# Patient Record
Sex: Male | Born: 1945 | Race: White | Hispanic: No | Marital: Married | Smoking: Never smoker
Health system: Southern US, Community
[De-identification: ages and names within clinical notes are randomized; demographics above are authoritative.]

## PROBLEM LIST (undated history)

## (undated) DIAGNOSIS — G43909 Migraine, unspecified, not intractable, without status migrainosus: Secondary | ICD-10-CM

## (undated) DIAGNOSIS — E079 Disorder of thyroid, unspecified: Secondary | ICD-10-CM

## (undated) DIAGNOSIS — N400 Enlarged prostate without lower urinary tract symptoms: Secondary | ICD-10-CM

## (undated) HISTORY — PX: OTHER SURGICAL HISTORY: SHX169

## (undated) HISTORY — DX: Benign prostatic hyperplasia without lower urinary tract symptoms: N40.0

## (undated) HISTORY — DX: Disorder of thyroid, unspecified: E07.9

## (undated) HISTORY — PX: HERNIA REPAIR: SHX51

## (undated) HISTORY — DX: Migraine, unspecified, not intractable, without status migrainosus: G43.909

---

## 1997-05-26 ENCOUNTER — Encounter: Admission: RE | Admit: 1997-05-26 | Discharge: 1997-08-24 | Payer: Self-pay | Admitting: Family Medicine

## 1999-11-09 ENCOUNTER — Encounter: Admission: RE | Admit: 1999-11-09 | Discharge: 1999-11-09 | Payer: Self-pay | Admitting: General Surgery

## 1999-11-09 ENCOUNTER — Encounter (HOSPITAL_BASED_OUTPATIENT_CLINIC_OR_DEPARTMENT_OTHER): Payer: Self-pay | Admitting: General Surgery

## 1999-11-13 ENCOUNTER — Encounter (INDEPENDENT_AMBULATORY_CARE_PROVIDER_SITE_OTHER): Payer: Self-pay | Admitting: *Deleted

## 1999-11-13 ENCOUNTER — Ambulatory Visit (HOSPITAL_BASED_OUTPATIENT_CLINIC_OR_DEPARTMENT_OTHER): Admission: RE | Admit: 1999-11-13 | Discharge: 1999-11-13 | Payer: Self-pay | Admitting: General Surgery

## 1999-11-13 ENCOUNTER — Emergency Department (HOSPITAL_COMMUNITY): Admission: EM | Admit: 1999-11-13 | Discharge: 1999-11-13 | Payer: Self-pay | Admitting: Emergency Medicine

## 2002-07-07 ENCOUNTER — Ambulatory Visit (HOSPITAL_COMMUNITY): Admission: RE | Admit: 2002-07-07 | Discharge: 2002-07-07 | Payer: Self-pay | Admitting: Gastroenterology

## 2004-11-13 ENCOUNTER — Encounter: Admission: RE | Admit: 2004-11-13 | Discharge: 2004-11-13 | Payer: Self-pay | Admitting: Family Medicine

## 2005-01-09 ENCOUNTER — Ambulatory Visit: Payer: Self-pay | Admitting: Internal Medicine

## 2005-05-07 ENCOUNTER — Ambulatory Visit: Payer: Self-pay | Admitting: Gastroenterology

## 2005-05-18 ENCOUNTER — Ambulatory Visit: Payer: Self-pay | Admitting: Gastroenterology

## 2007-06-23 ENCOUNTER — Ambulatory Visit (HOSPITAL_COMMUNITY): Admission: RE | Admit: 2007-06-23 | Discharge: 2007-06-23 | Payer: Self-pay | Admitting: Chiropractic Medicine

## 2009-06-17 ENCOUNTER — Ambulatory Visit (HOSPITAL_COMMUNITY): Admission: RE | Admit: 2009-06-17 | Discharge: 2009-06-17 | Payer: Self-pay | Admitting: Cardiology

## 2009-07-12 ENCOUNTER — Ambulatory Visit: Payer: Self-pay | Admitting: Internal Medicine

## 2009-07-12 DIAGNOSIS — E785 Hyperlipidemia, unspecified: Secondary | ICD-10-CM | POA: Insufficient documentation

## 2009-07-12 DIAGNOSIS — E782 Mixed hyperlipidemia: Secondary | ICD-10-CM | POA: Insufficient documentation

## 2009-07-12 DIAGNOSIS — R0609 Other forms of dyspnea: Secondary | ICD-10-CM | POA: Insufficient documentation

## 2009-07-12 DIAGNOSIS — R0989 Other specified symptoms and signs involving the circulatory and respiratory systems: Secondary | ICD-10-CM

## 2009-07-12 DIAGNOSIS — Z87448 Personal history of other diseases of urinary system: Secondary | ICD-10-CM | POA: Insufficient documentation

## 2009-07-12 DIAGNOSIS — Z87898 Personal history of other specified conditions: Secondary | ICD-10-CM | POA: Insufficient documentation

## 2009-07-12 DIAGNOSIS — G43909 Migraine, unspecified, not intractable, without status migrainosus: Secondary | ICD-10-CM | POA: Insufficient documentation

## 2009-08-15 ENCOUNTER — Encounter (HOSPITAL_COMMUNITY)
Admission: RE | Admit: 2009-08-15 | Discharge: 2009-09-20 | Payer: Self-pay | Source: Home / Self Care | Admitting: Internal Medicine

## 2010-01-31 NOTE — Assessment & Plan Note (Signed)
Summary: Pulmonary consultation/ ex intol ? etiology   Visit Type:  Initial Consult Copy to:  Dr. Denita Lung Primary Provider/Referring Provider:  Dr. Denita Lung  CC:  "Burning in lungs" .  History of Present Illness: 18 yowm never smoker new onset ex intolerance first noted spring 2010 but much worse in March 2011 assoc with chest burning leading to cardiac w/u with nl L Ht cath by [facility].  July 12, 2009 cc doe x > 1 year eg walking parking space to room uphill but no longer doing this and no longer pushing himself aerobically.  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  The patient denies shortness of breath at rest, productive cough, non-productive cough, coughing up blood, chest pain, irregular heartbeats, acid heartburn, indigestion, loss of appetite, weight change, difficulty swallowing, sore throat, tooth/dental problems, headaches, nasal congestion/difficulty breathing through nose, sneezing, itching, ear ache, anxiety, depression, hand/feet swelling, joint stiffness or pain, rash, change in color of mucus, and fever.    Vital Signs:  Patient profile:   65 year old male Height:      70 inches Weight:      197.38 pounds BMI:     28.42 O2 Sat:      98 % on Room air Temp:     98.2 degrees F oral Pulse rate:   59 / minute BP sitting:   122 / 70  (left arm)  Vitals Entered By: Vernie Murders (July 12, 2009 10:14 AM)  O2 Flow:  Room air   Other Orders: T-2 View CXR ([id number])  Patient Instructions: 1)  To resume aerobic activity - push yourself up to 30 min a day where you are sensing short of breath but never  out of breath 2)  If symptoms come back,  Acid reflux is the leading suspect here and needs to be eliminated  completely before considering additional studies or treatment options. To suppress this maximally, take prilosec 20mg  30 min  before first and last meal and plus diet measures as listed.  3)  GERD (REFLUX)  is a common cause of respiratory symptoms. It commonly presents without heartburn and can be treated with medication, but also with lifestyle changes including avoidance of late meals, excessive alcohol, smoking cessation, and avoid fatty foods, chocolate, peppermint, colas, red wine, and acidic juices such as orange juice. NO MINT OR MENTHOL PRODUCTS SO NO COUGH DROPS  4)  USE SUGARLESS CANDY INSTEAD (jolley ranchers)  5)  NO OIL BASED VITAMINS  6)  If not satisfied call me  Appended Document: Pulmonary consultation/ ex intol ? etiology      Copy to:  Dr. Denita Lung Primary Provider/Referring Provider:  Dr. Denita Lung   History of Present Illness: 65 yowm never smoker new onset ex intolerance first noted spring 2010 but much worse in March 2011 assoc with chest burning leading to cardiac w/u with nl L Ht cath by [facility] 06/17/09 with ? micorvascular angina rx with [facility] but d/c'd all rx on his own prior to initial pulmonary eval  July 12, 2009  1st pulmonary office eval   cc doe x > 1 year  eg walking parking space to room uphill but no longer doing this but  no longer pushing himself aerobically at all with much less symptoms now. No assoc hb or cough.  Pt denies any significant sore throat, dysphagia, itching, sneezing,  nasal congestion or excess secretions,  fever, chills, sweats, unintended wt loss, pleuritic or exertional cp, hempoptysis, change in activity tolerance  orthopnea pnd or leg swelling.  Pt also denies any obvious fluctuation in symptoms with weather or environmental change or other alleviating or aggravating factors.       Allergies: No Known Drug  Allergies  Past History:  Past Medical History: Hyperlipidemia Chest Burning with exercise     - Cardiac w/u with L HCath 06/17/09 LVEDP 17 ? microvascular angina  Family History: Lymphoma- Father Negative for respiratory diseases or atopy   Review of Systems  The patient denies anorexia, fever, weight loss, weight gain, vision loss, decreased hearing, hoarseness, chest pain, syncope, dyspnea on exertion, peripheral edema, prolonged cough, headaches, hemoptysis, abdominal pain, melena, hematochezia, severe indigestion/heartburn, hematuria, incontinence, genital sores, muscle weakness, suspicious skin lesions, transient blindness, difficulty walking, depression, unusual weight change, abnormal bleeding, enlarged lymph nodes, angioedema, breast masses, and testicular masses.    Serial Vital Signs/Assessments:  Comments: 11:02 AM Ambulatory Pulse Oximetry  Resting; HR__65___    02 Sat__97%ra___  Lap1 (185 feet)   HR__87___   02 Sat__96%ra___ Lap2 (185 feet)   HR__88___   02 Sat__96%ra___    Lap3 (185 feet)   HR__84___   02 Sat__97%ra___  _x__Test Completed without Difficulty ___Test Stopped due to:   By: Vernie Murders    Physical Exam  Additional Exam:  amb wm with classic voice fatigue and pseudowheeze resolves with purse lip maneuver wt 197 July 12, 2009 HEENT: nl dentition, turbinates, and orophanx. Nl external ear canals without cough reflex NECK :  without JVD/Nodes/TM/ nl carotid upstrokes bilaterally LUNGS: no acc muscle use, clear to A and P bilaterally without cough on insp or exp maneuvers CV:  RRR  no s3 or murmur or increase in P2, no edema  ABD:  soft and nontender with nl excursion in the supine position. No bruits or organomegaly, bowel sounds nl MS:  warm without deformities, calf tenderness, cyanosis or clubbing SKIN: warm and dry without lesions   NEURO:  alert, approp, no deficits     CXR  Procedure date:  07/12/2009  Findings:         Comparison: None.   Findings: Normal mediastinum and cardiac silhouette.  Costophrenic angles are clear.  No effusion, infiltrate, or pneumothorax.   IMPRESSION: No acute cardiopulmonary proces  Impression & Recommendations:  Problem # 1:  DYSPNEA (ICD-786.09) Not reproducible here and no evidence of a pulmonary limitation which could be largely deconditioning but this wouldn't explain the chest burning  which could have been microvasuclar angina as suggested by cards and has yet to push himself to the level he was previously or this could be an airway issue.    DDX of  difficult airways managment all start with A and  include Adherence, Ace Inhibitors, Acid Reflux, Active Sinus Disease, Alpha 1 Antitripsin deficiency, Anxiety masquerading as Airways dz,  ABPA,  allergy(esp in young), Aspiration (esp in elderly), Adverse effects of DPI,  Active smokers, plus one B  = Beta blocker use..   Acid reflux could be a unifying dx. try ppi/diet and f/u  Anxiety also a concern but is a dx of exclusion with CPST the next step if not  satisfied with response to rx.  Other Orders: Pulse Oximetry, Ambulatory (44010) Consultation Level V (27253)  Patient Instructions: 1)  1)  To resume aerobic activity - push yourself up to 30 min a day where you are sensing short of breath but never out of breath 2)  2)  If symptoms come back,  Acid reflux is the leading suspect here and needs to be eliminated  completely before considering additional studies or treatment options. To suppress this maximally, take prilosec 20mg  30 min  before first and last meal and plus diet measures as listed.  3)  3)  GERD (REFLUX)  is a common cause of respiratory symptoms. It commonly presents without heartburn and can be treated with medication, but also with lifestyle changes including avoidance of late meals, excessive alcohol, smoking cessation, and avoid fatty foods, chocolate, peppermint, colas, red wine, and acidic juices  such as orange juice. NO MINT OR MENTHOL PRODUCTS SO NO COUGH DROPS  4)  4)  USE SUGARLESS CANDY INSTEAD (jolley ranchers)  5)  5)  NO OIL BASED VITAMINS  6)  6)  If not satisfied call me to Schedule CPST

## 2010-05-19 NOTE — Op Note (Signed)
[place]  Patient:    Joseph Yang, Joseph Yang                    MRN: [id number] Proc. Date: 11/13/99 Adm. Date:  [id number] Attending:  Fortino Sic CC:         Barbette Hair. Arlyce Dice, M.D. [facility]   Operative Report  PREOPERATIVE DIAGNOSIS:  Fissure in ano  POSTOPERATIVE DIAGNOSIS:  Fissure in ano.  PROCEDURE:  Proctofissurectomy, sphincterotomy, and rubber banding of internal hemorrhoid.  SURGEON:  Marnee Spring. Wiliam Ke, M.D.  ASSISTANT:  None.  ANESTHESIA:  Endotracheal by hospital.  PROCEDURE:  After endotracheal anesthesia with the patient in the dorsolithotomy position the skin of the perineum was prepped and draped in the usual manner.  Procto was performed to 15 cm and was essentially negative. The patient had an anterior midline fissure in ano with a sentinel tag but no hyper ______ papilla.  This area was infiltrated with Xylocaine and Marcaine anesthesia.  A 2-0 silk suture was placed proximally.  The sentinel pile and fissure was excised and the wound was closed using the 3-0 chromic suture in an interlocking continuous fashion.  There was a large internal hemorrhoid just to the right of the anterior midline, and this was rubber banded above the dentate line with the Barons ligator.  A left lateral sphincterotomy was then performed.  The area in the left lateral part of the anus was infiltrated with Marcaine anesthesia.  The incision was made from the dentate line to the anal verge.  The sphincter muscle was pulled into the wound and divided with electrocautery current. This wound was then closed with running interlocking 3-0 chromic suture.  All the suture lines were examined for bleeding and were hemostatic.  The wound was dressed with Dibucaine ointment and 4 x 4s.  The patient was taken down from the dorsolithotomy position and the left the operating room in satisfactory condition after sponge and needle counts were verified. DD:   11/13/99 TD:  11/13/99 Job: [id number]

## 2010-06-06 ENCOUNTER — Telehealth: Payer: Self-pay | Admitting: Internal Medicine

## 2010-06-06 NOTE — Telephone Encounter (Signed)
Joseph Yang w/ short stay returned call.  She states that pt is scheduled for right umbilical hernia repair on 6.13.12 by Dr Luretha Murphy and is requesting pt's last cxr and ov note for pre-op for this procedure faxed to [phone], call-back is [phone].  Pt's last ov w/ MW was 7.12.11 with the cxr.  No upcoming appts w/ MW.  Note and cxr printed from EMR and faxed to the number above.  Called spoke with patient, advised records have been sent.  Pt verbalized his understanding.

## 2010-06-06 NOTE — Telephone Encounter (Signed)
LM for Joseph Yang TCB to find out what info she is needing.

## 2010-06-09 ENCOUNTER — Other Ambulatory Visit (HOSPITAL_COMMUNITY): Payer: BC Managed Care – PPO

## 2010-06-12 ENCOUNTER — Encounter (HOSPITAL_COMMUNITY): Payer: BC Managed Care – PPO

## 2010-06-12 ENCOUNTER — Other Ambulatory Visit (INDEPENDENT_AMBULATORY_CARE_PROVIDER_SITE_OTHER): Payer: Self-pay | Admitting: Surgery

## 2010-06-12 LAB — CBC
HCT: 44.6 % (ref 39.0–52.0)
MCH: 30.1 pg (ref 26.0–34.0)
MCV: 87.6 fL (ref 78.0–100.0)
RDW: 13.2 % (ref 11.5–15.5)
WBC: 4.2 10*3/uL (ref 4.0–10.5)

## 2010-06-14 ENCOUNTER — Ambulatory Visit (HOSPITAL_COMMUNITY)
Admission: RE | Admit: 2010-06-14 | Discharge: 2010-06-14 | Disposition: A | Discharge status: Home / Self Care | Payer: BC Managed Care – PPO | Source: Ambulatory Visit | Attending: Surgery | Admitting: Surgery

## 2010-06-14 DIAGNOSIS — K4090 Unilateral inguinal hernia, without obstruction or gangrene, not specified as recurrent: Secondary | ICD-10-CM | POA: Insufficient documentation

## 2010-06-14 DIAGNOSIS — Z7982 Long term (current) use of aspirin: Secondary | ICD-10-CM | POA: Insufficient documentation

## 2010-06-14 DIAGNOSIS — Z01812 Encounter for preprocedural laboratory examination: Secondary | ICD-10-CM | POA: Insufficient documentation

## 2010-06-14 DIAGNOSIS — K420 Umbilical hernia with obstruction, without gangrene: Secondary | ICD-10-CM | POA: Insufficient documentation

## 2010-06-14 DIAGNOSIS — E785 Hyperlipidemia, unspecified: Secondary | ICD-10-CM | POA: Insufficient documentation

## 2010-06-14 DIAGNOSIS — Z79899 Other long term (current) drug therapy: Secondary | ICD-10-CM | POA: Insufficient documentation

## 2010-06-14 NOTE — Op Note (Signed)
NAMETREVION, HOBEN NO.:  [id number]  MEDICAL RECORD NO.:  [id number]  LOCATION:  DAYL                         FACILITY:  [facility]  PHYSICIAN:  Thornton Park. Daphine Deutscher, MD  DATE OF BIRTH:  21-Nov-1945  DATE OF PROCEDURE:  06/14/2010 DATE OF DISCHARGE:                              OPERATIVE REPORT   PREOPERATIVE DIAGNOSIS:  Umbilical hernia, chronically incarcerated, right inguinal hernia.  POSTOPERATIVE DIAGNOSIS:  Right direct inguinal hernia and incarcerated umbilical hernia containing properitoneal fat.  PROCEDURES: 1. Right inguinal herniorrhaphy with Atrium mesh cut to fit to     buttress the floor of this direct inguinal hernia. 2. Umbilical herniorrhaphy with a preperitoneally placed ring of the     same Atrium mesh sutured in place with 2-0 Prolene and fascia close     primarily horizontally with Prolene.  SURGEON:  Thornton Park. Daphine Deutscher, MD  ANESTHESIA:  General endotracheal.  DESCRIPTION OF PROCEDURE:  This 65 year old [facility] was taken to room #11 on Wednesday, June 14, 2010, and given general anesthesia.  The abdomen was prepped with PCMX and draped sterilely. Preoperatively in the holding area we went over an open procedure with mesh placement and I marked him.  Appropriate time-out was performed.  I first worked into his right inguinal region and made a right oblique incision, carried it through a fairly generous adipose layer down to the external oblique.  This was mobilized where I could see internal ring and opened the fascia down into the external ring.  I mobilized the cord and got around it with a Penrose drain.  There was a very obvious frank floor defect with a large bulge of preperitoneal fat protruding through this defect.  I repaired this by cutting a piece of Atrium mesh to fit the region and sewed it along the inguinal ligament with a running 2-0 Prolene getting deep, getting good bites of medially the Cooper's ligament  and then more laterally the inguinal ligament.  Medially I went and got good bites of internal oblique, completely obliterating and controlling the hernia defect.  This was brought around the cord structures and sutured to itself. There was plenty of room in the gap between the mesh to allow his cord structures plenty of room.  The external oblique was then closed over this with running 2-0 Vicryl. Exparel, the long-acting anesthetic, was injected in the wound.  It was closed with 4-0 Vicryl subcutaneously and subcuticularly with a running 4-0 Monocryl.  Next, I made an infraumbilical incision and dissected a herniated incarcerated fatty mass from the undersurface of the umbilicus.  Once this was done, I reduced the fat into the abdomen.  This little hole would barely accommodate my pinky.  I cut a little piece of mesh and secured it above and below with horizontal mattress sutures of 2-0 Prolene.  These pledgets withheld the mesh in a preperitoneal space.  I then closed the mesh and the defect transversely with interrupted 2-0 Prolene.  The skin of the umbilicus was tacked to the fascia with 4-0 Vicryl and the wound was closed 4-0 Vicryl.  Wounds were all closed with Dermabond. The patient tolerated the procedure well.  He will be  given Percocet 5/325 to take as needed for pain.  I called his wife and discussed these findings with her and I will see her back in the office in 3 weeks.     Thornton Park Daphine Deutscher, MD     MBM/MEDQ  D:  06/14/2010  T:  06/14/2010  Job:  [id number]  cc:   Magnus Sinning) Tenny Craw, M.D. Fax: [phone]  Valetta Fuller, M.D. Fax: [phone]  Electronically Signed by Luretha Murphy MD on 06/14/2010 04:34:38 PM

## 2010-06-16 ENCOUNTER — Emergency Department (HOSPITAL_COMMUNITY)
Admission: EM | Admit: 2010-06-16 | Discharge: 2010-06-16 | Disposition: A | Discharge status: Home / Self Care | Payer: BC Managed Care – PPO | Attending: Emergency Medicine | Admitting: Emergency Medicine

## 2010-06-16 DIAGNOSIS — E039 Hypothyroidism, unspecified: Secondary | ICD-10-CM | POA: Insufficient documentation

## 2010-06-16 DIAGNOSIS — Z9889 Other specified postprocedural states: Secondary | ICD-10-CM | POA: Insufficient documentation

## 2010-06-16 DIAGNOSIS — IMO0002 Reserved for concepts with insufficient information to code with codable children: Secondary | ICD-10-CM | POA: Insufficient documentation

## 2010-06-16 DIAGNOSIS — X58XXXA Exposure to other specified factors, initial encounter: Secondary | ICD-10-CM | POA: Insufficient documentation

## 2010-06-16 DIAGNOSIS — S301XXA Contusion of abdominal wall, initial encounter: Secondary | ICD-10-CM | POA: Insufficient documentation

## 2010-06-16 LAB — URINALYSIS, ROUTINE W REFLEX MICROSCOPIC
Bilirubin Urine: NEGATIVE
Specific Gravity, Urine: 1.023 (ref 1.005–1.030)
pH: 5.5 (ref 5.0–8.0)

## 2010-06-16 LAB — CBC
HCT: 45.3 % (ref 39.0–52.0)
MCV: 89.9 fL (ref 78.0–100.0)
RBC: 5.04 MIL/uL (ref 4.22–5.81)
RDW: 13.7 % (ref 11.5–15.5)
WBC: 6.8 10*3/uL (ref 4.0–10.5)

## 2010-06-16 LAB — URINE MICROSCOPIC-ADD ON

## 2010-06-22 ENCOUNTER — Encounter (INDEPENDENT_AMBULATORY_CARE_PROVIDER_SITE_OTHER): Payer: Self-pay | Admitting: Surgery

## 2010-07-03 ENCOUNTER — Ambulatory Visit (INDEPENDENT_AMBULATORY_CARE_PROVIDER_SITE_OTHER): Payer: BC Managed Care – PPO | Admitting: Surgery

## 2010-07-03 ENCOUNTER — Encounter (INDEPENDENT_AMBULATORY_CARE_PROVIDER_SITE_OTHER): Payer: Self-pay | Admitting: Surgery

## 2010-07-03 DIAGNOSIS — G8918 Other acute postprocedural pain: Secondary | ICD-10-CM

## 2010-07-03 NOTE — Progress Notes (Signed)
Mr. Rideaux had urinary retention and a purple penis.  He had to go to the ER to have that checked out and was reassured after some tests.  Today his incisions look good and he has an intact repair of a direct right inguinal hernia and an umbilical hernia.  He is scheduled to be on jury duty this Thursday and he will try to complete this but if he isn't able I will support his excuse. Return 6 weeks

## 2010-07-27 ENCOUNTER — Encounter (INDEPENDENT_AMBULATORY_CARE_PROVIDER_SITE_OTHER): Payer: Self-pay | Admitting: General Surgery

## 2010-08-18 ENCOUNTER — Ambulatory Visit (INDEPENDENT_AMBULATORY_CARE_PROVIDER_SITE_OTHER): Payer: BC Managed Care – PPO | Admitting: Surgery

## 2010-08-18 VITALS — Temp 97.2°F

## 2010-08-18 DIAGNOSIS — K4090 Unilateral inguinal hernia, without obstruction or gangrene, not specified as recurrent: Secondary | ICD-10-CM

## 2010-08-18 NOTE — Progress Notes (Signed)
Joseph Yang was lifting an extension ladder  last week and felt some pain in his right groin. On examination there is no evidence of a recurrent hernia. It is not tender. I think he probably pulled around the mesh and the sutures generated the pain.  I reassured him and suggested he return to see me whenever needed. Plan return p.r.n.

## 2012-05-01 ENCOUNTER — Encounter: Payer: Self-pay | Admitting: Gastroenterology

## 2012-05-21 IMAGING — CR DG CHEST 2V
2 series · 2 of 2 positions shown · non-contrast
Comparison: None.

CLINICAL DATA: Dyspnea

CHEST - 2 VIEW

[view not recorded (1 of 2)]
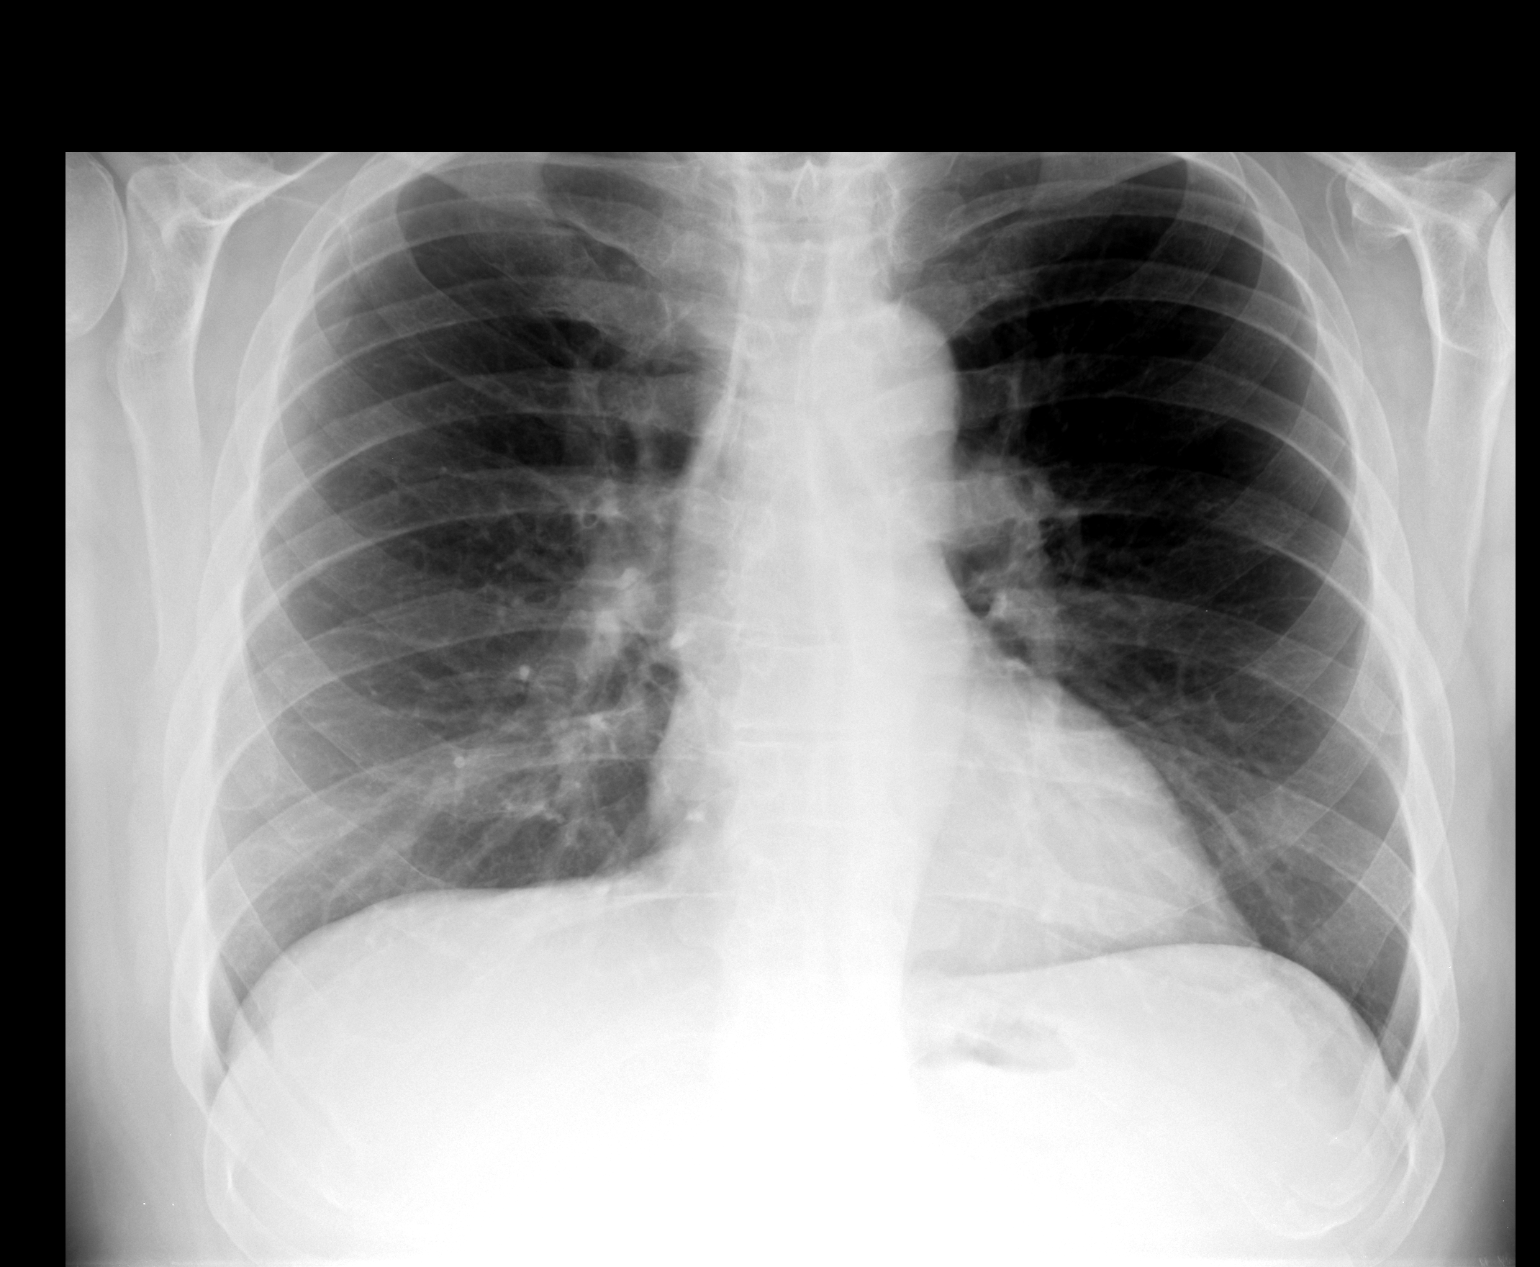

[view not recorded (2 of 2)]
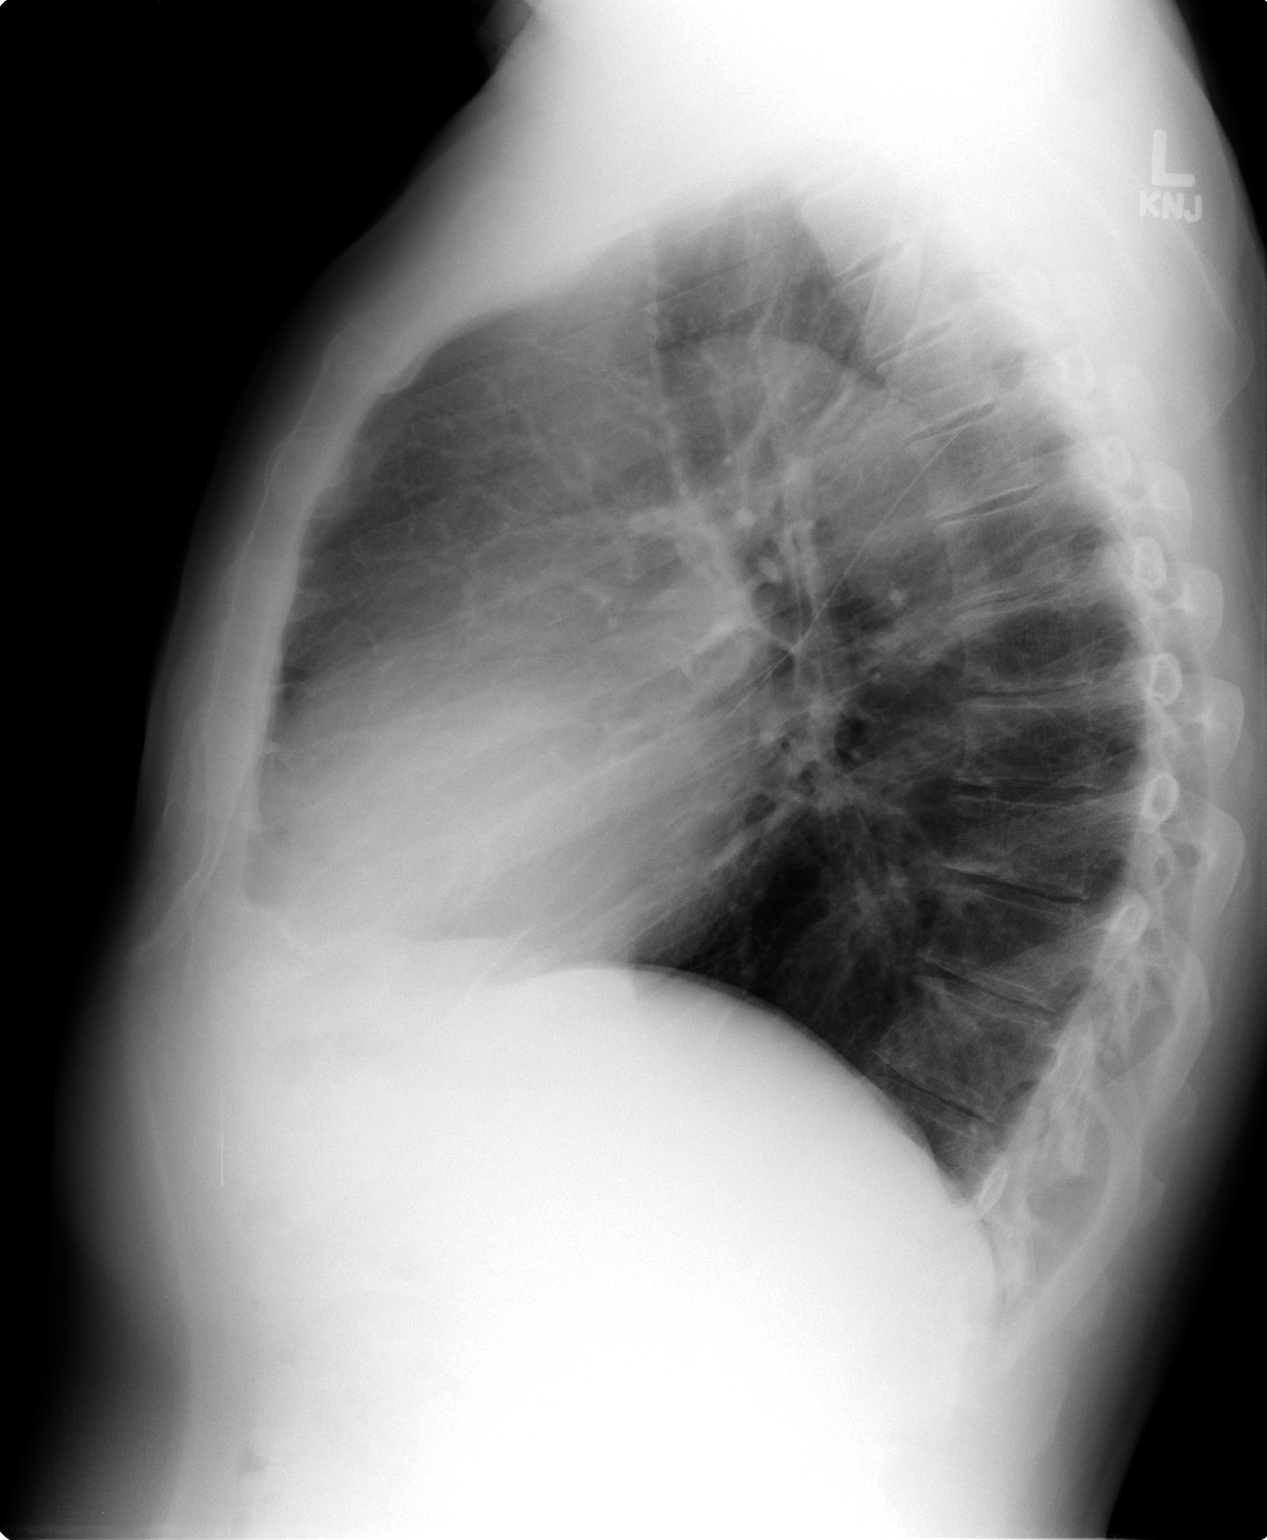

[2 of 2 positions shown; findings below may reference images not displayed]

FINDINGS: Normal mediastinum and cardiac silhouette.  Costophrenic
angles are clear.  No effusion, infiltrate, or pneumothorax.
IMPRESSION: No acute cardiopulmonary process.

## 2013-07-30 ENCOUNTER — Ambulatory Visit: Payer: Self-pay | Admitting: Podiatrist

## 2013-07-30 ENCOUNTER — Encounter: Payer: Self-pay | Admitting: Gastroenterology

## 2015-06-24 ENCOUNTER — Ambulatory Visit (INDEPENDENT_AMBULATORY_CARE_PROVIDER_SITE_OTHER)
Admission: RE | Admit: 2015-06-24 | Discharge: 2015-06-24 | Disposition: A | Discharge status: Home / Self Care | Payer: BC Managed Care – PPO | Source: Ambulatory Visit | Attending: Internal Medicine | Admitting: Internal Medicine

## 2015-06-24 ENCOUNTER — Encounter: Payer: Self-pay | Admitting: Internal Medicine

## 2015-06-24 ENCOUNTER — Ambulatory Visit (INDEPENDENT_AMBULATORY_CARE_PROVIDER_SITE_OTHER): Payer: BC Managed Care – PPO | Admitting: Internal Medicine

## 2015-06-24 VITALS — BP 144/82 | HR 58 | Ht 70.0 in | Wt 241.8 lb

## 2015-06-24 DIAGNOSIS — R05 Cough: Secondary | ICD-10-CM | POA: Diagnosis not present

## 2015-06-24 DIAGNOSIS — R053 Chronic cough: Secondary | ICD-10-CM

## 2015-06-24 DIAGNOSIS — R072 Precordial pain: Secondary | ICD-10-CM | POA: Diagnosis not present

## 2015-06-24 MED ORDER — BUDESONIDE 90 MCG/ACT IN AEPB: 2.0000 | INHALATION_SPRAY | Freq: Two times a day (BID) | RESPIRATORY_TRACT | Status: DC

## 2015-06-24 MED ORDER — FLUTICASONE PROPIONATE 50 MCG/ACT NA SUSP: 2.0000 | Freq: Every day | NASAL | Status: DC

## 2015-06-24 MED ORDER — PREDNISONE 10 MG PO TABS: ORAL_TABLET | ORAL | Status: DC

## 2015-06-24 NOTE — Progress Notes (Signed)
Subjective:    Patient ID: Joseph Yang, male    DOB: 19-Apr-1945, 70 y.o.   MRN: [id number]  PCP  Duane Lopeoss, Alan, MD   HPI  IOV 06/24/2015  Chief Complaint  Patient presents with  . Advice Only    Refered by Dr. Tenny Crawoss for cough.  Pt has had prod cough, pnd, frequent throat clearing X4 mos.    History is gained from talking to the patient and review of the referral notes and old chart   70 year old male referred for cough but he describes this is inflammation in the lungs and a feeling of warmth.  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 He tells me that normally once a year he gets a upper respiratory infection with a cough that last 3 weeks. He thought he had a similar such episode in January 2017 which was the first episode. However one was unusual this time was that he was having exertional chest pain to the point that he thought he was having a heart attack. The episode lasted the typical 3 weeks and then resolved. With this the chest pain also resolved. Then he was doing better but had a recurrence again in March with a respiratory upper infection. This is again similar to January in which she had chest pain again lasted 3 weeks and then resolved. Then in May 2017 had another respiratory infection upper respiratory treated with Z-Pak this time. He does start feeling better. He is able to do yard work but he feels he has a feeling of warmth, lobe, inflammation in the lungs associated with clearing of the throat and some chest tightness but no actual wheeze or cough. He did have exertional chest pain initially as well as this episode but it resolved. He says [facility] Dr. Peterson LombardEdmund subjective in for cardiac cath and this was normal at that time. He admits to constant clearing of throat. RSI cough score is 15 and suggested irritable larynx syndrome.  Exhaled nitric oxide today in our office 06/24/2015:  33 ppb  Laboratory  2012 his CBC was normal         Dr Gretta CoolKouffman Reflux Symptom Index (> 13-15 suggestive of LPR cough)  06/24/2015   Hoarseness of problem with voice 3  Clearing  Of Throat 3  Excess throat mucus or feeling of post nasal drip 3  Difficulty swallowing food, liquid or tablets 1  Cough after eating or lying down 0  Breathing difficulties or choking episodes 0  Troublesome or annoying cough 2  Sensation of something sticking in throat or lump in throat 0  Heartburn, chest pain, indigestion, or stomach acid coming up 3  TOTAL 15      Chest x-ray 07/12/2009: Personally visualized is clear      has a past medical history of Thyroid disease and Enlarged prostate.   reports that he has never smoked. He has never used smokeless tobacco.  Past Surgical History  Procedure Laterality Date  . Hernia repair    . Thyroid ablation      No Known Allergies  Immunization History  Administered Date(s) Administered  . Influenza Split 11/24/2014  . Pneumococcal Polysaccharide-23 06/23/2012    Family History  Problem Relation Age of Onset  . Lymphoma Father   . Cancer Father   . Cancer Paternal Grandfather      Current outpatient prescriptions:  .  Cholecalciferol (VITAMIN D3) 5000 units CAPS, Take  1 capsule by mouth 2 (two) times a week., Disp: , Rfl:  .  levothyroxine (SYNTHROID, LEVOTHROID) 125 MCG tablet, Take 125 mcg by mouth daily.  , Disp: , Rfl:  .  tamsulosin (FLOMAX) 0.4 MG CAPS capsule, Take 0.4 mg by mouth daily., Disp: , Rfl:      Review of Systems  Constitutional: Negative for fever and unexpected weight change.  HENT: Positive for congestion. Negative for dental problem, ear pain, nosebleeds, postnasal drip, rhinorrhea, sinus pressure, sneezing, sore throat and trouble swallowing.   Eyes: Negative for redness and itching.  Respiratory: Positive for cough. Negative for chest tightness, shortness of breath and wheezing.   Cardiovascular: Negative for palpitations  and leg swelling.  Gastrointestinal: Negative for nausea and vomiting.  Genitourinary: Negative for dysuria.  Musculoskeletal: Negative for joint swelling.  Skin: Negative for rash.  Neurological: Negative for headaches.  Hematological: Does not bruise/bleed easily.  Psychiatric/Behavioral: Negative for dysphoric mood. The patient is not nervous/anxious.        Objective:   Physical Exam  Constitutional: He is oriented to person, place, and time. He appears well-developed and well-nourished. No distress.  HENT:  Head: Normocephalic and atraumatic.  Right Ear: External ear normal.  Left Ear: External ear normal.  Mouth/Throat: Oropharynx is clear and moist. No oropharyngeal exudate.  Mild post nasal drainage + Constant clearing of throat +  Eyes: Conjunctivae and EOM are normal. Pupils are equal, round, and reactive to light. Right eye exhibits no discharge. Left eye exhibits no discharge. No scleral icterus.  Neck: Normal range of motion. Neck supple. No JVD present. No tracheal deviation present. No thyromegaly present.  Cardiovascular: Normal rate, regular rhythm and intact distal pulses.  Exam reveals no gallop and no friction rub.   No murmur heard. Pulmonary/Chest: Effort normal and breath sounds normal. No respiratory distress. He has no wheezes. He has no rales. He exhibits no tenderness.  Abdominal: Soft. Bowel sounds are normal. He exhibits no distension and no mass. There is no tenderness. There is no rebound and no guarding.  Visceral obesity +  Musculoskeletal: Normal range of motion. He exhibits no edema or tenderness.  Lymphadenopathy:    He has no cervical adenopathy.  Neurological: He is alert and oriented to person, place, and time. He has normal reflexes. No cranial nerve deficit. Coordination normal.  Skin: Skin is warm and dry. No rash noted. He is not diaphoretic. No erythema. No pallor.  Psychiatric: He has a normal mood and affect. His behavior is normal.  Judgment and thought content normal.  Nursing note and vitals reviewed.   Filed Vitals:   06/24/15 0957  BP: 144/82  Pulse: 58  Height:  (1.778 m)  Weight: 241 lb 12.8 oz (109.68 kg)  SpO2: 97%         Assessment & Plan:     ICD-9-CM ICD-10-CM   1. Chronic cough 786.2 R05   2. Precordial pain 786.51 R07.2    Chronic cough - possibley due to sinus drainage and cough variant asthma - Do chest x-ray 2 view =  take generic fluticasone inhaler 2 squirts each nostril daily - Take prednisone 40 mg daily x 2 days, then  daily x 2 days, then  daily x 2 days, then  daily x 2 days and stop - start pulmicort tubohaler 2 puff twice daily - if this is expensive please let us know  Precordial pain - Refer cardiology- Dr Jacinto Halim or [facility] - whoever can see  him sooner  Follow-up 4-8 weeks to report progress  - FeNO and cough score at followup   Dr. Kalman ShanMurali Franky Reier, M.D., [facility].C.P Pulmonary and Critical Care Medicine Staff Physician [facility] Pager: [phone], If no answer or between  15:00h - 7:00h: call [phone]  06/24/2015 10:32 AM

## 2015-06-24 NOTE — Addendum Note (Signed)
Addended by: Marvene StaffOX, Kano Heckmann H on: 06/24/2015 10:41 AM   Modules accepted: Orders

## 2015-06-24 NOTE — Patient Instructions (Signed)
Chronic cough - possibley due to sinus drainage and cough variant asthma - Do chest x-ray 2 view =  take generic fluticasone inhaler 2 squirts each nostril daily - Take prednisone 40 mg daily x 2 days, then 20mg  daily x 2 days, then 10mg  daily x 2 days, then 5mg  daily x 2 days and stop - start pulmicort tubohaler 2 puff twice daily - if this is expensive please let us know  Precordial pain - Ref[facility] cardiology- [facility]Ganji [facility] - whoever can see him sooner  Follow-up 4-8 weeks to report progress  - FeNO and cough score at followup

## 2015-06-24 NOTE — Addendum Note (Signed)
Addended by: Vito BackersOX, SHEENA H on: 06/24/2015 10:39 AM   Modules accepted: Orders

## 2015-07-13 ENCOUNTER — Institutional Professional Consult (permissible substitution): Payer: BC Managed Care – PPO | Admitting: Internal Medicine

## 2015-07-18 ENCOUNTER — Telehealth: Payer: Self-pay | Admitting: Internal Medicine

## 2015-07-18 NOTE — Telephone Encounter (Signed)
Per 06/24/15 OV: Patient Instructions       Chronic cough - possibley due to sinus drainage and cough variant asthma - Do chest x-ray 2 view =  take generic fluticasone inhaler 2 squirts each nostril daily - Take prednisone 40 mg daily x 2 days, then 20mg  daily x 2 days, then 10mg  daily x 2 days, then 5mg  daily x 2 days and stop - start pulmicort tubohaler 2 puff twice daily - if this is expensive please let us know  ---  Called spoke with pt. Pulmicort flexinhaler is $30 co pay. Advised pt if this is too expensive for him, then he should contact his insurance provider to see if the copays are cheaper/more expensive on other inhalers. He reports he will request his formulary and call us back. Nothing further needed

## 2015-08-08 ENCOUNTER — Ambulatory Visit: Payer: BC Managed Care – PPO | Admitting: Cardiovascular Disease

## 2015-08-16 ENCOUNTER — Encounter: Payer: Self-pay | Admitting: Internal Medicine

## 2015-08-16 ENCOUNTER — Ambulatory Visit (INDEPENDENT_AMBULATORY_CARE_PROVIDER_SITE_OTHER): Payer: BC Managed Care – PPO | Admitting: Internal Medicine

## 2015-08-16 VITALS — BP 138/70 | HR 56 | Ht 70.0 in | Wt 239.0 lb

## 2015-08-16 DIAGNOSIS — R05 Cough: Secondary | ICD-10-CM

## 2015-08-16 DIAGNOSIS — J45991 Cough variant asthma: Secondary | ICD-10-CM

## 2015-08-16 DIAGNOSIS — R0982 Postnasal drip: Secondary | ICD-10-CM | POA: Insufficient documentation

## 2015-08-16 DIAGNOSIS — R053 Chronic cough: Secondary | ICD-10-CM

## 2015-08-16 MED ORDER — FLUTICASONE FUROATE 100 MCG/ACT IN AEPB: 1.0000 | INHALATION_SPRAY | Freq: Every day | RESPIRATORY_TRACT | 0 refills | Status: DC

## 2015-08-16 NOTE — Patient Instructions (Addendum)
ICD-9-CM ICD-10-CM   1. Chronic cough 786.2 R05   2. 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 Post-nasal drip 784.91 R09.82    chagne pulmicort to arunity once a day  - see if bladder irritation resolve; per our textbook happens < 5% of the time with pulmicort Please talk about leg heaviness with Joseph Yang  Joseph Yang, Alan, MD Flu shot in fall Any respiratory issues please call us Glad stress test is normal  Followup 3 to 6 months or sooner if needed; consider feno at followup

## 2015-08-16 NOTE — Progress Notes (Signed)
Subjective:     Patient ID: Joseph Yang, male   DOB: 08/08/1945, 70 y.o.   MRN: [id number]  HPI  PCP  Duane Lopeoss, Alan, MD   HPI  IOV 06/24/2015  Chief Complaint  Patient presents with  . Advice Only    Refered by Dr. Tenny Crawoss for cough.  Pt has had prod cough, pnd, frequent throat clearing X4 mos.    History is gained from talking to the patient and review of the referral notes and old chart   70 year old male referred for cough but he describes this is inflammation in the lungs and a feeling of warmth.  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 He tells me that normally once a year he gets a upper respiratory infection with a cough that last 3 weeks. He thought he had a similar such episode in January 2017 which was the first episode. However one was unusual this time was that he was having exertional chest pain to the point that he thought he was having a heart attack. The episode lasted the typical 3 weeks and then resolved. With this the chest pain also resolved. Then he was doing better but had a recurrence again in March with a respiratory upper infection. This is again similar to January in which she had chest pain again lasted 3 weeks and then resolved. Then in May 2017 had another respiratory infection upper respiratory treated with Z-Pak this time. He does start feeling better. He is able to do yard work but he feels he has a feeling of warmth, lobe, inflammation in the lungs associated with clearing of the throat and some chest tightness but no actual wheeze or cough. He did have exertional chest pain initially as well as this episode but it resolved. He says that [facility] Dr. Peterson LombardEdmund subjective in for cardiac cath and this was normal at that time. He admits to constant clearing of throat. RSI cough score is 15 and suggested irritable larynx syndrome.  Exhaled nitric oxide today in our office 06/24/2015:  33  ppb  Laboratory 2012 his CBC was normal    Chest x-ray 07/12/2009: Personally visualized is clear    has a past medical history of Thyroid disease and Enlarged prostate.   reports that he has never smoked. He has never used smokeless tobacco.    OV 08/16/2015  Chief Complaint  Patient presents with  . Follow-up    Pt states he feels improved since last OV. Pt states the chest pain has improved. Pt denies significant cough.     Fu chronic cough  - felt due to chronic cough and sinus drainage  Last visti he had precordial symptoms - referred to cards and he reports stress test was normal. Advied pulmicort for high Feno and dx of coug variant asthma. He took it allongwith new Rx of statin and this gave him "bladder irritiation". So he stopped taking both meds and symptoms resolved. Also, his pulmocort mdi runs out n15 days so he has not taken ICS in a few weeks. Cough is some better at RSI cough score 7 compared to 15 last visti. However, he feels due to above reasons has not had full opportunit to see If ICS working well or not. He wants an alternative to pulmicort. Despite improvement FeNO is without change  Also, c/o some leg heaviness several weeks esp when getting out of car. No focal deficits    Dr  Kouffman Reflux Symptom Index (> 13-15 suggestive of LPR cough)  06/24/2015  08/16/2015   feno 33 31  Hoarseness of problem with voice 3 1  Clearing  Of Throat 3 2  Excess throat mucus or feeling of post nasal drip 3 2  Difficulty swallowing food, liquid or tablets 1 1  Cough after eating or lying down 0 0  Breathing difficulties or choking episodes 0 0  Troublesome or annoying cough 2 0  Sensation of something sticking in throat or lump in throat 0 0  Heartburn, chest pain, indigestion, or stomach acid coming up 3 1  TOTAL 15 7      has a past medical history of Enlarged prostate and Thyroid disease.   reports that he has never smoked. He has never used smokeless  tobacco.  Past Surgical History:  Procedure Laterality Date  . HERNIA REPAIR    . thyroid ablation      No Known Allergies  Immunization History  Administered Date(s) Administered  . Influenza Split 11/24/2014  . Pneumococcal Polysaccharide-23 06/23/2012    Family History  Problem Relation Age of Onset  . Lymphoma Father   . Cancer Father   . Cancer Paternal Grandfather      Current Outpatient Prescriptions:  .  Cholecalciferol (VITAMIN D3) 5000 units CAPS, Take 1 capsule by mouth 2 (two) times a week., Disp: , Rfl:  .  fluticasone (FLONASE) 50 MCG/ACT nasal spray, Place 2 sprays into both nostrils daily., Disp: 16 g, Rfl: 2 .  levothyroxine (SYNTHROID, LEVOTHROID) 125 MCG tablet, Take 125 mcg by mouth daily.  , Disp: , Rfl:  .  tamsulosin (FLOMAX) 0.4 MG CAPS capsule, Take 0.4 mg by mouth daily., Disp: , Rfl:  .  Budesonide 90 MCG/ACT inhaler, Inhale 2 puffs into the lungs 2 (two) times daily., Disp: 1 Inhaler, Rfl: 5   Review of Systems     Objective:   Physical Exam  Constitutional: He is oriented to person, place, and time. He appears well-developed and well-nourished. No distress.  HENT:  Head: Normocephalic and atraumatic.  Right Ear: External ear normal.  Left Ear: External ear normal.  Mouth/Throat: Oropharynx is clear and moist. No oropharyngeal exudate.  Eyes: Conjunctivae and EOM are normal. Pupils are equal, round, and reactive to light. Right eye exhibits no discharge. Left eye exhibits no discharge. No scleral icterus.  Neck: Normal range of motion. Neck supple. No JVD present. No tracheal deviation present. No thyromegaly present.  Cardiovascular: Normal rate, regular rhythm and intact distal pulses.  Exam reveals no gallop and no friction rub.   No murmur heard. Pulmonary/Chest: Effort normal and breath sounds normal. No respiratory distress. He has no wheezes. He has no rales. He exhibits no tenderness.  Abdominal: Soft. Bowel sounds are normal. He  exhibits no distension and no mass. There is no tenderness. There is no rebound and no guarding.  Musculoskeletal: Normal range of motion. He exhibits no edema or tenderness.  Lymphadenopathy:    He has no cervical adenopathy.  Neurological: He is alert and oriented to person, place, and time. He has normal reflexes. No cranial nerve deficit. Coordination normal.  Skin: Skin is warm and dry. No rash noted. He is not diaphoretic. No erythema. No pallor.  Psychiatric: He has a normal mood and affect. His behavior is normal. Judgment and thought content normal.  Nursing note and vitals reviewed.   Vitals:   08/16/15 1531  BP: 138/70  Pulse: (!) 56  SpO2: 97%  Weight: 239 lb (108.4 kg)  Height: 5\' 10"  (1.778 m)        Assessment:       ICD-9-CM ICD-10-CM   1. Chronic cough 786.2 R05   2. 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 Post-nasal drip 784.91 R09.82    He is improved but feno is unchanged. Hard to say if improvement is passage of time or nasal steroids or partial ICS Rx.We discussed this and he wants an alternative ICS due to bladder side effects and insurance refill issues. We will try and see if his symptoms resolve or improve with arunity and track it withs ymtpoms/ feno. He is agreeable with plan    Plan:      chagne pulmicort to arunity once a day  - see if bladder irritation resolve; per our textbook happens < 5% of the time with pulmicort Please talk about leg heaviness with pcp  Duane Lope, MD Flu shot in fall Any respiratory issues please call us Glad stress test is normal  Followup 3 to 6 months or sooner if needed     Dr. Kalman Shan, M.D., [facility]C.P Pulmonary and Critical Care Medicine Staff Physician  [facility] [place] [facility] Pager: [phone], If no answer or between  15:00h - 7:00h: call [phone]  08/16/2015 10:17 PM

## 2016-01-16 ENCOUNTER — Ambulatory Visit: Payer: BC Managed Care – PPO | Admitting: Internal Medicine

## 2016-01-17 ENCOUNTER — Other Ambulatory Visit: Payer: Self-pay | Admitting: Internal Medicine

## 2016-02-07 ENCOUNTER — Ambulatory Visit (INDEPENDENT_AMBULATORY_CARE_PROVIDER_SITE_OTHER): Payer: BC Managed Care – PPO | Admitting: Internal Medicine

## 2016-02-07 ENCOUNTER — Encounter: Payer: Self-pay | Admitting: Internal Medicine

## 2016-02-07 VITALS — BP 136/76 | HR 52 | Ht 70.0 in | Wt 251.8 lb

## 2016-02-07 DIAGNOSIS — R0982 Postnasal drip: Secondary | ICD-10-CM

## 2016-02-07 DIAGNOSIS — R053 Chronic cough: Secondary | ICD-10-CM

## 2016-02-07 DIAGNOSIS — J45991 Cough variant asthma: Secondary | ICD-10-CM | POA: Diagnosis not present

## 2016-02-07 DIAGNOSIS — R05 Cough: Secondary | ICD-10-CM | POA: Diagnosis not present

## 2016-02-07 LAB — NITRIC OXIDE: NITRIC OXIDE: 23

## 2016-02-07 NOTE — Patient Instructions (Addendum)
ICD-9-CM ICD-10-CM   1. Chronic cough 786.2 R05 Nitric oxide  2. 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 Post-nasal drip 784.91 R09.82     Glad you are better Nitric oxide test is normal  PLAN Office staff to update flu shot status for 2017-2018 season Continue pulmicort but cut down to 2 puff once a day  - if cough getting worse then you can increase your pulmicort back to 2 puff twice daily Continue as needed nasal steroid spray   Followup 9 months or sooner if needed; consider feno at followup

## 2016-02-07 NOTE — Progress Notes (Signed)
Subjective:     Patient ID: Joseph Yang, male   DOB: 1945-05-09, 71 y.o.   MRN: [id number]  HPI  PCP  Duane Lopeoss, Alan, MD   HPI  IOV 06/24/2015  Chief Complaint  Patient presents with  . Advice Only    Refered by Dr. Tenny Crawoss for cough.  Pt has had prod cough, pnd, frequent throat clearing X4 mos.    History is gained from talking to the patient and review of the referral notes and old chart   71 year old male referred for cough but he describes this is inflammation in the lungs and a feeling of warmth.  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 He tells me that normally once a year he gets a upper respiratory infection with a cough that last 3 weeks. He thought he had a similar such episode in January 2017 which was the first episode. However one was unusual this time was that he was having exertional chest pain to the point that he thought he was having a heart attack. The episode lasted the typical 3 weeks and then resolved. With this the chest pain also resolved. Then he was doing better but had a recurrence again in March with a respiratory upper infection. This is again similar to January in which she had chest pain again lasted 3 weeks and then resolved. Then in May 2017 had another respiratory infection upper respiratory treated with Z-Pak this time. He does start feeling better. He is able to do yard work but he feels he has a feeling of warmth, lobe, inflammation in the lungs associated with clearing of the throat and some chest tightness but no actual wheeze or cough. He did have exertional chest pain initially as well as this episode but it resolved. He says that over 8 years ago at [facility] Dr. Peterson LombardEdmund subjective in for cardiac cath and this was normal at that time. He admits to constant clearing of throat. RSI cough score is 15 and suggested irritable larynx syndrome.  Exhaled nitric oxide today in our office 06/24/2015:  33  ppb  Laboratory 2012 his CBC was normal    Chest x-ray 07/12/2009: Personally visualized is clear    has a past medical history of Thyroid disease and Enlarged prostate.   reports that he has never smoked. He has never used smokeless tobacco.    OV 08/16/2015  Chief Complaint  Patient presents with  . Follow-up    Pt states he feels improved since last OV. Pt states the chest pain has improved. Pt denies significant cough.     Fu chronic cough  - felt due to chronic cough and sinus drainage  Last visti he had precordial symptoms - referred to cards and he reports stress test was normal. Advied pulmicort for high Feno and dx of coug variant asthma. He took it allongwith new Rx of statin and this gave him "bladder irritiation". So he stopped taking both meds and symptoms resolved. Also, his pulmocort mdi runs out n15 days so he has not taken ICS in a few weeks. Cough is some better at RSI cough score 7 compared to 15 last visti. However, he feels due to above reasons has not had full opportunit to see If ICS working well or not. He wants an alternative to pulmicort. Despite improvement FeNO is without change  Also, c/o some leg heaviness several weeks esp when getting out of car. No focal deficits  OV 02/07/2016  Chief Complaint  Patient presents with  . Follow-up    pt reports cough is better but now his oice is hoarse, didnt try the arnuity due to side effect warning     Follow-up chronic cough due to cough variant astma and sinus drainage   lastseen Augus 2017. He continues to do well. He did not think inhaled fluticasone after reading side effects 6. Sodium] inhaled Pulmicort. He did not realize this is a class effect. Nevertheless he is tolerating pulmicort well. He feels his cough is under control. RSI cough score is 9 and shows only mild amount of cough. 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 He  tellsme that the precordial chest pain that he had before inhaled steoids has never happened again and therefore he feelsinhaled sterois is helping him.  Dr Gretta Cool Reflux Symptom Index (> 13-15 suggestive of LPR cough)  06/24/2015 feno 33  08/16/2015  02/07/2016 feno 23  feno 33 31 2  Hoarseness of problem with voice 3 1 2   Clearing  Of Throat 3 2 2   Excess throat mucus or feeling of post nasal drip 3 2 2   Difficulty swallowing food, liquid or tablets 1 1 0  Cough after eating or lying down 0 0 0  Breathing difficulties or choking episodes 0 0 0  Troublesome or annoying cough 2 0 0  Sensation of something sticking in throat or lump in throat 0 0 1  Heartburn, chest pain, indigestion, or stomach acid coming up 3 1 0  TOTAL 15 7 9    Immunization History  Administered Date(s) Administered  . Influenza Split 11/24/2014  . Pneumococcal Polysaccharide-23 06/23/2012      has a past medical history of Enlarged prostate and Thyroid disease.   reports that he has never smoked. He has never used smokeless tobacco.  Past Surgical History:  Procedure Laterality Date  . HERNIA REPAIR    . thyroid ablation      No Known Allergies  Immunization History  Administered Date(s) Administered  . Influenza Split 11/24/2014  . Pneumococcal Polysaccharide-23 06/23/2012    Family History  Problem Relation Age of Onset  . Lymphoma Father   . Cancer Father   . Cancer Paternal Grandfather      Current Outpatient Prescriptions:  .  Cholecalciferol (VITAMIN D3) 5000 units CAPS, Take 1 capsule by mouth 2 (two) times a week., Disp: , Rfl:  .  fluticasone (FLONASE) 50 MCG/ACT nasal spray, Place 2 sprays into both nostrils daily., Disp: 16 g, Rfl: 2 .  levothyroxine (SYNTHROID, LEVOTHROID) 125 MCG tablet, Take 125 mcg by mouth daily.  , Disp: , Rfl:  .  PULMICORT FLEXHALER 90 MCG/ACT inhaler, INHALE 2 PUFFS INTO THE LUNGS 2 (TWO) TIMES DAILY., Disp: 1 each, Rfl: 5 .  tamsulosin (FLOMAX) 0.4 MG  CAPS capsule, Take 0.4 mg by mouth daily., Disp: , Rfl:    Review of Systems     Objective:   Physical Exam  Constitutional: He is oriented to person, place, and time. He appears well-developed and well-nourished. No distress.  HENT:  Head: Normocephalic and atraumatic.  Right Ear: External ear normal.  Left Ear: External ear normal.  Mouth/Throat: Oropharynx is clear and moist. No oropharyngeal exudate.  Eyes: Conjunctivae and EOM are normal. Pupils are equal, round, and reactive to light. Right eye exhibits no discharge. Left eye exhibits no discharge. No scleral icterus.  Neck:  Normal range of motion. Neck supple. No JVD present. No tracheal deviation present. No thyromegaly present.  Cardiovascular: Normal rate, regular rhythm and intact distal pulses.  Exam reveals no gallop and no friction rub.   No murmur heard. Pulmonary/Chest: Effort normal and breath sounds normal. No respiratory distress. He has no wheezes. He has no rales. He exhibits no tenderness.  Abdominal: Soft. Bowel sounds are normal. He exhibits no distension and no mass. There is no tenderness. There is no rebound and no guarding.  Musculoskeletal: Normal range of motion. He exhibits no edema or tenderness.  Lymphadenopathy:    He has no cervical adenopathy.  Neurological: He is alert and oriented to person, place, and time. He has normal reflexes. No cranial nerve deficit. Coordination normal.  Skin: Skin is warm and dry. No rash noted. He is not diaphoretic. No erythema. No pallor.  Psychiatric: He has a normal mood and affect. His behavior is normal. Judgment and thought content normal.  Nursing note and vitals reviewed.  Vitals:   02/07/16 1625  BP: 136/76  Pulse: (!) 52  SpO2: 99%  Weight: 251 lb 12.8 oz (114.2 kg)  Height: 5\' 10"  (1.778 m)    Estimated body mass index is 36.13 kg/m as calculated from the following:   Height as of this encounter: 5\' 10"  (1.778 m).   Weight as of this encounter: 251 lb  12.8 oz (114.2 kg).     Assessment:       ICD-9-CM ICD-10-CM   1. Chronic cough 786.2 R05 Nitric oxide  2. 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 Post-nasal drip 784.91 R09.82        Plan:      Glad you are better Nitric oxide test is normal  PLAN Office staff to update flu shot status for 2017-2018 season Continue pulmicort but cut down to 2 puff once a day  - if cough getting worse then you can increase your pulmicort back to 2 puff twice daily Continue as needed nasal steroid spray   Followup 9 months or sooner if needed; consider feno at followup    Dr. Kalman Shan, M.D., [facility]C.P Pulmonary and Critical Care Medicine Staff Physician [facility] [place] [facility] Pager: [phone], If no answer or between  15:00h - 7:00h: call [phone]  02/07/2016 5:07 PM

## 2016-12-26 DIAGNOSIS — N182 Chronic kidney disease, stage 2 (mild): Secondary | ICD-10-CM | POA: Insufficient documentation

## 2016-12-26 DIAGNOSIS — E0500 Thyrotoxicosis with diffuse goiter without thyrotoxic crisis or storm: Secondary | ICD-10-CM | POA: Insufficient documentation

## 2016-12-26 DIAGNOSIS — E890 Postprocedural hypothyroidism: Secondary | ICD-10-CM | POA: Insufficient documentation

## 2016-12-26 DIAGNOSIS — Z791 Long term (current) use of non-steroidal anti-inflammatories (NSAID): Secondary | ICD-10-CM | POA: Insufficient documentation

## 2016-12-26 DIAGNOSIS — J45909 Unspecified asthma, uncomplicated: Secondary | ICD-10-CM | POA: Insufficient documentation

## 2016-12-26 DIAGNOSIS — R3129 Other microscopic hematuria: Secondary | ICD-10-CM | POA: Insufficient documentation

## 2017-01-30 DIAGNOSIS — R768 Other specified abnormal immunological findings in serum: Secondary | ICD-10-CM | POA: Insufficient documentation

## 2017-01-30 DIAGNOSIS — R030 Elevated blood-pressure reading, without diagnosis of hypertension: Secondary | ICD-10-CM | POA: Insufficient documentation

## 2017-08-13 DIAGNOSIS — M25561 Pain in right knee: Secondary | ICD-10-CM | POA: Insufficient documentation

## 2017-10-22 ENCOUNTER — Ambulatory Visit: Payer: Self-pay | Admitting: Orthopedic Surgery

## 2017-10-28 ENCOUNTER — Encounter (HOSPITAL_BASED_OUTPATIENT_CLINIC_OR_DEPARTMENT_OTHER): Payer: Self-pay | Admitting: *Deleted

## 2017-12-19 ENCOUNTER — Ambulatory Visit (HOSPITAL_BASED_OUTPATIENT_CLINIC_OR_DEPARTMENT_OTHER): Admit: 2017-12-19 | Payer: BC Managed Care – PPO | Admitting: Specialist

## 2017-12-19 ENCOUNTER — Encounter (HOSPITAL_BASED_OUTPATIENT_CLINIC_OR_DEPARTMENT_OTHER): Payer: Self-pay

## 2017-12-19 SURGERY — ARTHROSCOPY, KNEE, WITH MEDIAL MENISCECTOMY
Anesthesia: General | Laterality: Right

## 2019-02-09 ENCOUNTER — Ambulatory Visit: Payer: BC Managed Care – PPO

## 2019-02-26 ENCOUNTER — Ambulatory Visit: Payer: BC Managed Care – PPO

## 2020-10-28 ENCOUNTER — Encounter: Payer: Self-pay | Admitting: Podiatry

## 2020-10-28 ENCOUNTER — Other Ambulatory Visit: Payer: Self-pay

## 2020-10-28 ENCOUNTER — Ambulatory Visit: Payer: BC Managed Care – PPO | Admitting: Podiatry

## 2020-10-28 DIAGNOSIS — B351 Tinea unguium: Secondary | ICD-10-CM | POA: Diagnosis not present

## 2020-10-28 DIAGNOSIS — L600 Ingrowing nail: Secondary | ICD-10-CM

## 2020-10-28 NOTE — Progress Notes (Signed)
Subjective:   Patient ID: Joseph Yang, male   DOB: 75 y.o.   MRN: [id number]   HPI Patient presents stating he has had a damaged right hallux nail that really been bothering him and making shoe gear difficult and has been this way for a number of years but worse recently and the left nail is somewhat thickened and discolored.  Patient does not smoke likes to be active and still works   Review of Systems  All other systems reviewed and are negative.      Objective:  Physical Exam Vitals and nursing note reviewed.  Constitutional:      Appearance: He is well-developed.  Pulmonary:     Effort: Pulmonary effort is normal.  Musculoskeletal:        General: Normal range of motion.  Skin:    General: Skin is warm.  Neurological:     Mental Status: He is alert.    Neurovascular status found to be intact muscle strength found to be adequate range of motion found to be adequate.  Patient's right hallux nail is very thick and dystrophic and loose with probable recent trauma that is creating more pain with the left being yellow and discolored currently patient has good digital perfusion well oriented x3     Assessment:  Damaged right hallux nail thick and dystrophic with moderate pain along with mycotic nail infection bilateral hallux     Plan:  H&P reviewed conditions and for the right I do think long-term removal of the nail is best and I explained procedure risk and patient wants this done.  He understands will be a permanent procedure today I went ahead and infiltrated 60 mg like Marcaine mixture sterile prep done using sterile instrumentation remove the hallux nail exposed matrix applied phenol 5 applications 30 seconds followed by alcohol by sterile dressing gave instructions on soaks and reappoint to recheck may need treatment for the adjacent nail at 1 point future for the left hallux nail with topical that can be used

## 2020-10-28 NOTE — Patient Instructions (Signed)

## 2021-02-20 ENCOUNTER — Other Ambulatory Visit: Payer: Self-pay

## 2021-02-20 ENCOUNTER — Ambulatory Visit (INDEPENDENT_AMBULATORY_CARE_PROVIDER_SITE_OTHER): Payer: BC Managed Care – PPO | Admitting: Neurology

## 2021-02-20 ENCOUNTER — Encounter: Payer: Self-pay | Admitting: Neurology

## 2021-02-20 VITALS — BP 142/82 | HR 52 | Ht 70.0 in | Wt 225.0 lb

## 2021-02-20 DIAGNOSIS — G47419 Narcolepsy without cataplexy: Secondary | ICD-10-CM

## 2021-02-20 DIAGNOSIS — G3184 Mild cognitive impairment, so stated: Secondary | ICD-10-CM | POA: Diagnosis not present

## 2021-02-20 NOTE — Progress Notes (Signed)
[facility]  PATIENT: Joseph Yang DOB: 27-Nov-1945  REQUESTING CLINICIAN: Daisy Floro, MD HISTORY FROM: Patient  REASON FOR VISIT: Memory problem/Narcolepsy     HISTORICAL  CHIEF COMPLAINT:  Chief Complaint  Patient presents with   New Patient (Initial Visit)    Rm 13. Alone. 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 States last week he had a confusing incident where he was unable to realize he had the wrong car key for a period of time, during the incident he called his wife for help. Pt c/o injuries due acting out his dreams during sleep walking episodes.  MMSE 29/30.    HISTORY OF PRESENT ILLNESS:  This is a 76 year old gentleman with past medical history of hypothyroidism on Synthroid, vitamin B12 deficiency and vitamin D deficiency on supplements who is presenting for period of confusion and memory problem.  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  Sometimes he reports having difficulty remembering his students, remembering their faces, and a couple time he was watching a learning video of himself but does not remember when he made the learning video  There was also a episode of confusion when he confuse his car keys, called his wife about the incident but later realized that he was using the wrong key for car.  Patient also reports last week he misses his granddaughter birthday, he had posted it on the wall but still missed it and he was very affected by that.  Other than that he still drives, denies any recent accident, he was lost in a familiar place but was able to go back on the right way, did not need to call people for direction, he still very independent, still works, and independent in all ADLs IADLs.    TBI: No past history of TBI Stroke: no past history of stroke Seizures: no past history of seizures Sleep: no history of sleep apnea.  Has never had sleep study but reports  Narcolepsy Mood: Patient denies anxiety and depression but worrues about every details   Functional status: independent in all ADLs and IADLs Patient lives with wife in a 1 with no stairs. Cooking: Wife Cleaning: Both  Shopping: Both  Bathing: no help needed  Toileting: no help needed  Driving: Still, no recent accident but got lost in a familiar place  Bills: Both,  Medications: B12 supplement, Synthroid, vitamin D supplement and Ativan as needed Ever left the stove on by accident?: No Forget how to use items around the house?: No Getting lost going to familiar places?: Yes  Forgetting loved ones names?: No Word finding difficulty? No  Sleep: Yes    OTHER MEDICAL CONDITIONS: Vitamin B12, Hypothyroidism, Vit D deficiency    REVIEW OF SYSTEMS: Full 14 system review of systems performed and negative with exception of: as noted in the HPI   ALLERGIES: Allergies  Allergen Reactions   Lidocaine Anaphylaxis and Other (See Comments)    Nasal lidocaine.  Pt states he uses the Lidocaine patch from time to time and does okay with that. Pt reports, 'after laryngoscopy I had severe eye pain, but unsure if it was from lidocaine. The doctors put a lidocaine allergy on my permanent record for some reason'    Pseudoephedrine Anaphylaxis   Pseudoephedrine Hcl Other (See Comments)    HOME MEDICATIONS: Outpatient Medications Prior to Visit  Medication Sig Dispense Refill   Cholecalciferol (VITAMIN D3) 5000 units CAPS Take 1 capsule by mouth 2 (two)  times a week.     levothyroxine (SYNTHROID, LEVOTHROID) 125 MCG tablet Take 125 mcg by mouth daily.       lidocaine (LIDODERM) 5 % SMARTSIG:1-2 Patch(s) Topical Daily     LORazepam (ATIVAN) 1 MG tablet lorazepam 1 mg tablet     vitamin B-12 (CYANOCOBALAMIN) 500 MCG tablet Take 500 mcg by mouth daily.     polyethylene glycol-electrolytes (NULYTELY) 420 g solution peg-electrolyte solution 420 gram oral solution  TAKE AS DIRECTED     No  facility-administered medications prior to visit.    PAST MEDICAL HISTORY: Past Medical History:  Diagnosis Date   Enlarged prostate    Thyroid disease     PAST SURGICAL HISTORY: Past Surgical History:  Procedure Laterality Date   HERNIA REPAIR     thyroid ablation      FAMILY HISTORY: Family History  Problem Relation Age of Onset   Lymphoma Father    Cancer Father    Cancer Paternal Grandfather     SOCIAL HISTORY: Social History   Socioeconomic History   Marital status: Married    Spouse name: Not on file   Number of children: Not on file   Years of education: Not on file   Highest education level: Not on file  Occupational History   Not on file  Tobacco Use   Smoking status: Never   Smokeless tobacco: Never  Substance and Sexual Activity   Alcohol use: Not Currently   Drug use: Not on file   Sexual activity: Not on file  Other Topics Concern   Not on file  Social History Narrative   Not on file   Social Determinants of Health   Financial Resource Strain: Not on file  Food Insecurity: Not on file  Transportation Needs: Not on file  Physical Activity: Not on file  Stress: Not on file  Social Connections: Not on file  Intimate Partner Violence: Not on file    PHYSICAL EXAM  GENERAL EXAM/CONSTITUTIONAL: Vitals:  Vitals:   02/20/21 1309  BP: (!) 142/82  Pulse: (!) 52  Weight: 225 lb (102.1 kg)  Height: 5\' 10"  (1.778 m)   Body mass index is 32.28 kg/m. Wt Readings from Last 3 Encounters:  02/20/21 225 lb (102.1 kg)  02/07/16 251 lb 12.8 oz (114.2 kg)  08/16/15 239 lb (108.4 kg)   Patient is in no distress; well developed, nourished and groomed; neck is supple  CARDIOVASCULAR: Examination of carotid arteries is normal; no carotid bruits Regular rate and rhythm, no murmurs Examination of peripheral vascular system by observation and palpation is normal  EYES: Pupils round and reactive to light, Visual fields full to confrontation,  Extraocular movements intacts,   MUSCULOSKELETAL: Gait, strength, tone, movements noted in Neurologic exam below  NEUROLOGIC: MENTAL STATUS:  MMSE - Mini Mental State Exam 02/20/2021  Orientation to time 4  Orientation to Place 5  Registration 3  Attention/ Calculation 5  Recall 3  Language- name 2 objects 2  Language- repeat 1  Language- follow 3 step command 3  Language- read & follow direction 1  Write a sentence 1  Copy design 1  Total score 29    CRANIAL NERVE:  2nd, 3rd, 4th, 6th - pupils equal and reactive to light, visual fields full to confrontation, extraocular muscles intact, no nystagmus 5th - facial sensation symmetric 7th - facial strength symmetric 8th - hearing intact 9th - palate elevates symmetrically, uvula midline 11th - shoulder shrug symmetric 12th - tongue protrusion midline  MOTOR:  normal bulk and tone, full strength in the BUE, BLE  SENSORY:  normal and symmetric to light touch, vibration  COORDINATION:  finger-nose-finger, fine finger movements normal  REFLEXES:  deep tendon reflexes present and symmetric  GAIT/STATION:  normal   DIAGNOSTIC DATA (LABS, IMAGING, TESTING) - I reviewed patient records, labs, notes, testing and imaging myself where available.  Lab Results  Component Value Date   WBC 6.8 06/16/2010   HGB 15.3 06/16/2010   HCT 45.3 06/16/2010   MCV 89.9 06/16/2010   PLT 178 06/16/2010   No results found for: NA, K, CL, CO2, GLUCOSE, BUN, CREATININE, CALCIUM, PROT, ALBUMIN, AST, ALT, ALKPHOS, BILITOT, GFRNONAA, GFRAA No results found for: CHOL, HDL, LDLCALC, LDLDIRECT, TRIG, CHOLHDL No results found for: SWHQ7R No results found for: VITAMINB12 No results found for: TSH    ASSESSMENT AND PLAN  76 y.o. year old male with history of hypothyroidism, vitamin B12 and D deficiency, anxiety, reported history of narcolepsy who is presenting with memory problem.  Patient reports incidents where he is confused, forgetful  about his students name, sometimes does not remember their faces but otherwise he is very independent, able to do all activities of daily living and a IADLs, scored 29 out of 30 on the Mini-Mental status exam and clinically I could not see any deficit on exam. I told him he might have a mild cognitive impairment and does not need any treatment at this time but I will check his reversible dementia panel.  His main problem was actually narcolepsy.  He did not report it to his primary care doctor, the episodes of daytime sleepiness, only reported memory problems so this referral was for memory problems.  I explained to the patient that I am not sleep neurologist but I will refer him to one of my colleagues who can help manage his narcolepsy.  He is comfortable with plans.  Advised him to follow-up as needed for his memory problem.   1. 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   Return if symptoms worsen or fail to improve.    Windell Norfolk, MD 02/20/2021, 4:56 PM  [facility] [address] [place], [place] [address] [phone]

## 2021-02-20 NOTE — Patient Instructions (Addendum)
Continue current medications  Will check dementia labs today, will contact him to go over the results Return as needed

## 2021-02-21 LAB — DEMENTIA PANEL
Homocysteine: 12.5 umol/L (ref 0.0–19.2)
RPR Ser Ql: NONREACTIVE
TSH: 0.189 u[IU]/mL — ABNORMAL LOW (ref 0.450–4.500)
Vitamin B-12: 1688 pg/mL — ABNORMAL HIGH (ref 232–1245)

## 2021-02-21 NOTE — Progress Notes (Signed)
Elmore Guise  Your recent dementia labs we checked were within normal limits. We checked  vitamin B12 level, RPR and thyroid function, . No further action is required on these tests at this time. Please keep any upcoming appointments or tests and call us with any interim questions, concerns, problems or updates. Thanks,   Windell Norfolk, MD

## 2021-06-19 ENCOUNTER — Ambulatory Visit (INDEPENDENT_AMBULATORY_CARE_PROVIDER_SITE_OTHER): Payer: BC Managed Care – PPO | Admitting: Podiatry

## 2021-06-19 ENCOUNTER — Encounter: Payer: Self-pay | Admitting: Podiatry

## 2021-06-19 DIAGNOSIS — B351 Tinea unguium: Secondary | ICD-10-CM | POA: Diagnosis not present

## 2021-06-19 DIAGNOSIS — L600 Ingrowing nail: Secondary | ICD-10-CM | POA: Diagnosis not present

## 2021-06-19 NOTE — Progress Notes (Signed)
Subjective:   Patient ID: Joseph Yang, male   DOB: 76 y.o.   MRN: [id number]   HPI Patient presents stating he was wanted to get his right big toenail it was removed checked and the left one gets thick also it can be bothersome   ROS      Objective:  Physical Exam  Neurovascular status intact muscle strength adequate with patient found to have a well-healed nail site right big toenail left shows some thickness yellow discoloration and patient is found to have good digital perfusion well oriented x3     Assessment:  Damaged hallux nail left right 1 healed well from previous nail surgery     Plan:  H&P reviewed both nails we could remove the left elbow we will get a hold off and he will use debridement techniques along with soaks.  Right one is healing well and he can file it as needed

## 2021-06-27 ENCOUNTER — Other Ambulatory Visit: Payer: Self-pay | Admitting: Family Medicine

## 2021-06-27 DIAGNOSIS — E7800 Pure hypercholesterolemia, unspecified: Secondary | ICD-10-CM

## 2021-11-22 ENCOUNTER — Other Ambulatory Visit: Payer: BC Managed Care – PPO

## 2021-11-23 ENCOUNTER — Other Ambulatory Visit: Payer: BC Managed Care – PPO

## 2021-12-22 ENCOUNTER — Ambulatory Visit
Admission: RE | Admit: 2021-12-22 | Discharge: 2021-12-22 | Disposition: A | Discharge status: Home / Self Care | Payer: No Typology Code available for payment source | Source: Ambulatory Visit | Attending: Family Medicine | Admitting: Family Medicine

## 2021-12-22 DIAGNOSIS — E7800 Pure hypercholesterolemia, unspecified: Secondary | ICD-10-CM

## 2022-03-08 ENCOUNTER — Encounter (HOSPITAL_COMMUNITY): Payer: Self-pay

## 2022-03-08 ENCOUNTER — Emergency Department (HOSPITAL_COMMUNITY): Payer: BC Managed Care – PPO

## 2022-03-08 ENCOUNTER — Emergency Department (HOSPITAL_COMMUNITY)
Admission: EM | Admit: 2022-03-08 | Discharge: 2022-03-08 | Disposition: A | Discharge status: Home / Self Care | Payer: BC Managed Care – PPO | Attending: Emergency Medicine | Admitting: Emergency Medicine

## 2022-03-08 DIAGNOSIS — H538 Other visual disturbances: Secondary | ICD-10-CM | POA: Diagnosis present

## 2022-03-08 DIAGNOSIS — G459 Transient cerebral ischemic attack, unspecified: Secondary | ICD-10-CM | POA: Insufficient documentation

## 2022-03-08 DIAGNOSIS — H539 Unspecified visual disturbance: Secondary | ICD-10-CM

## 2022-03-08 LAB — COMPREHENSIVE METABOLIC PANEL
ALT: 18 U/L (ref 0–44)
AST: 21 U/L (ref 15–41)
Albumin: 4.6 g/dL (ref 3.5–5.0)
Alkaline Phosphatase: 61 U/L (ref 38–126)
Anion gap: 11 (ref 5–15)
BUN: 15 mg/dL (ref 8–23)
CO2: 21 mmol/L — ABNORMAL LOW (ref 22–32)
Calcium: 9.1 mg/dL (ref 8.9–10.3)
Chloride: 105 mmol/L (ref 98–111)
Creatinine, Ser: 1.37 mg/dL — ABNORMAL HIGH (ref 0.61–1.24)
GFR, Estimated: 54 mL/min — ABNORMAL LOW (ref >60)
Glucose, Bld: 91 mg/dL (ref 70–99)
Potassium: 4 mmol/L (ref 3.5–5.1)
Sodium: 137 mmol/L (ref 135–145)
Total Bilirubin: 3.1 mg/dL — ABNORMAL HIGH (ref 0.3–1.2)
Total Protein: 7.7 g/dL (ref 6.5–8.1)

## 2022-03-08 LAB — CBC
HCT: 50.1 % (ref 39.0–52.0)
Hemoglobin: 17 g/dL (ref 13.0–17.0)
MCH: 30.3 pg (ref 26.0–34.0)
MCHC: 33.9 g/dL (ref 30.0–36.0)
MCV: 89.3 fL (ref 80.0–100.0)
Platelets: 170 10*3/uL (ref 150–400)
RBC: 5.61 MIL/uL (ref 4.22–5.81)
RDW: 12.7 % (ref 11.5–15.5)
WBC: 4.8 10*3/uL (ref 4.0–10.5)
nRBC: 0 % (ref 0.0–0.2)

## 2022-03-08 LAB — PROTIME-INR
INR: 1 (ref 0.8–1.2)
Prothrombin Time: 13.3 seconds (ref 11.4–15.2)

## 2022-03-08 LAB — DIFFERENTIAL
Abs Immature Granulocytes: 0.01 10*3/uL (ref 0.00–0.07)
Basophils Absolute: 0 10*3/uL (ref 0.0–0.1)
Basophils Relative: 0 %
Eosinophils Absolute: 0 10*3/uL (ref 0.0–0.5)
Eosinophils Relative: 0 %
Immature Granulocytes: 0 %
Lymphocytes Relative: 23 %
Lymphs Abs: 1.1 10*3/uL (ref 0.7–4.0)
Monocytes Absolute: 0.3 10*3/uL (ref 0.1–1.0)
Monocytes Relative: 7 %
Neutro Abs: 3.3 10*3/uL (ref 1.7–7.7)
Neutrophils Relative %: 70 %

## 2022-03-08 LAB — ETHANOL: Alcohol, Ethyl (B): <10 mg/dL (ref <10)

## 2022-03-08 LAB — APTT: aPTT: 39 seconds — ABNORMAL HIGH (ref 24–36)

## 2022-03-08 MED ORDER — SODIUM CHLORIDE 0.9% FLUSH: 3.0000 mL | Freq: Once | INTRAVENOUS | Status: DC

## 2022-03-08 NOTE — ED Provider Triage Note (Signed)
Emergency Medicine Provider Triage Evaluation Note  Joseph Yang , a 77 y.o. male  was evaluated in triage.  Pt complains of vision change earlier today. 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 Continued lessening about finally resolved after about 15 minutes. Afterwards felt somewhat confused and had difficulty finding words. Felt somewhat weaker on his left side and his "body felt weird". Has no symptoms at this time. Is mostly concerned today about a mini stroke.   Recently dx with aortic aneurysm  Review of Systems  Positive: Vision change, weakness Negative: Headache, eye pain, numbness  Physical Exam  BP (!) 151/102 (BP Location: Left Arm)   Pulse 87   Temp 98.2 F (36.8 C) (Oral)   Resp 18   SpO2 96%  Gen:   Awake, no distress   Resp:  Normal effort  MSK:   Moves extremities without difficulty  Other:  5/5 strength in all extremities, PERRLA, EOMI, CN exam grossly intact, no facial droop, no slurred speech  Medical Decision Making  Medically screening exam initiated at 4:42 PM.  Appropriate orders placed.  [name] was informed that the remainder of the evaluation will be completed by another provider, this initial triage assessment does not replace that evaluation, and the importance of remaining in the ED until their evaluation is complete.  Workup initiated   Kateri Plummer, PA-C 03/08/22 1649

## 2022-03-08 NOTE — ED Provider Notes (Signed)
[facility] Provider Note   CSN: [id number] Arrival date & time: 03/08/22  1623     History Chief Complaint  Patient presents with   Visual Field Change    HPI Joseph Yang is a 77 y.o. male presenting for multiple neurologic symptoms earlier today. 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  Symptoms lasted approximately 15 minutes before resolving spontaneously.  He denies fevers or chills, nausea vomiting, syncope shortness of breath.  He is currently ambulatory tolerating p.o. intake and denies any symptoms at this time.  No known sick contacts. Endorses a history of a recently diagnosed thoracic aortic aneurysm though he states it is small and currently in the monitoring phase.  Denies any neck pain, vertigo, tinnitus.  Patient's recorded medical, surgical, social, medication list and allergies were reviewed in the Snapshot window as part of the initial history.   Review of Systems   Review of Systems  Constitutional:  Negative for chills and fever.  HENT:  Negative for ear pain and sore throat.   Eyes:  Positive for visual disturbance. Negative for pain.  Respiratory:  Negative for cough and shortness of breath.   Cardiovascular:  Negative for chest pain and palpitations.  Gastrointestinal:  Negative for abdominal pain and vomiting.  Genitourinary:  Negative for dysuria and hematuria.  Musculoskeletal:  Negative for arthralgias and back pain.  Skin:  Negative for color change and rash.  Neurological:  Positive for speech difficulty and weakness. Negative for seizures and syncope.  All other systems reviewed and are negative.   Physical Exam Updated Vital Signs BP 131/88   Pulse 63   Temp 98.2 F (36.8 C)   Resp 15   SpO2 100%  Physical Exam Vitals and  nursing note reviewed.  Constitutional:      General: He is not in acute distress.    Appearance: He is well-developed.  HENT:     Head: Normocephalic and atraumatic.  Eyes:     Conjunctiva/sclera: Conjunctivae normal.  Cardiovascular:     Rate and Rhythm: Normal rate and regular rhythm.     Heart sounds: No murmur heard. Pulmonary:     Effort: Pulmonary effort is normal. No respiratory distress.     Breath sounds: Normal breath sounds.  Abdominal:     Palpations: Abdomen is soft.     Tenderness: There is no abdominal tenderness.  Musculoskeletal:        General: No swelling.     Cervical back: Neck supple.  Skin:    General: Skin is warm and dry.     Capillary Refill: Capillary refill takes less than 2 seconds.  Neurological:     Mental Status: He is alert.  Psychiatric:        Mood and Affect: Mood normal.      ED Course/ Medical Decision Making/ A&P    Procedures Procedures   Medications Ordered in ED Medications  sodium chloride flush (NS) 0.9 % injection 3 mL (has no administration in time range)    Medical Decision Making:    [name] is a 77 y.o. male who presented to the ED today with multiple neurologic symptoms detailed above.     Patient's presentation is complicated by their history of advanced age, multiple comorbid medical problems.  Patient placed on continuous vitals and telemetry monitoring while in ED which was reviewed periodically.   Complete initial physical exam performed, notably the patient  was hemodynamically stable in no acute distress.  Currently asymptomatic.  Detailed neurologic exam performed with no motor or sensory abnormalities, reassuring cranial nerve exam.    4 limbs all with normal sensation, normal strength, normal coordination including heel-shin, motion counter motion of the upper extremities, finger-nose All cranial nerves evaluated with no residual symptoms. Reviewed and confirmed nursing documentation for past  medical history, family history, social history.    Initial Assessment:   With the patient's presentation of neurologic symptoms earlier in the day, most likely diagnosis is TIA. Other diagnoses were considered including (but not limited to) intracranial hemorrhage, intracranial mass, MS, orbital pathology including retinal detatchment, acute glaucoma. These are considered less likely due to history of present illness and physical exam findings.  Especially given bilateral nature of the symptoms and resolution, orbital pathology seems less likely This is most consistent with an acute life/limb threatening illness complicated by underlying chronic conditions.  Initial Plan:  CT head to evaluate for intracranial hemorrhagic etiology of symptoms MRI head to evaluate for neurologic etiology of symptoms such as CVA or TIA Screening labs including CBC and Metabolic panel to evaluate for infectious or metabolic etiology of disease.  EKG to evaluate for cardiac pathology. Objective evaluation as below reviewed with plan for close reassessment  Initial Study Results:   Laboratory  All laboratory results reviewed without evidence of clinically relevant pathology.     EKG EKG was reviewed independently. Rate, rhythm, axis, intervals all examined and without medically relevant abnormality. ST segments without concerns for elevations.    Radiology  All images reviewed independently. Agree with radiology report at this time.   MR BRAIN WO CONTRAST  Result Date: 03/08/2022 CLINICAL DATA:  Stroke EXAM: MRI HEAD WITHOUT CONTRAST TECHNIQUE: Multiplanar, multiecho pulse sequences of the brain and surrounding structures were obtained without intravenous contrast. COMPARISON:  CT Head 03/08/22 FINDINGS: Brain: Negative for acute infarct. No hydrocephalus. No extra-axial fluid collection. No significant microvascular ischemic change. No hemorrhage. No midline shift Vascular: Normal flow voids. Skull and upper  cervical spine: Normal marrow signal. Sinuses/Orbits: Trace right mastoid effusion. No middle ear effusion. Paranasal sinuses are grossly clear. Orbits are unremarkable. Other: None. IMPRESSION: No acute abnormality.  No finding to explain vision changes. Electronically Signed   By: Marin Roberts M.D.   On: 03/08/2022 20:12   CT HEAD WO CONTRAST  Result Date: 03/08/2022 CLINICAL DATA:  Transient ischemic attack (TIA) Patient reports vision change, now resolved. EXAM: CT HEAD WITHOUT CONTRAST TECHNIQUE: Contiguous axial images were obtained from the base of the skull through the vertex without intravenous contrast. RADIATION DOSE REDUCTION: This exam was performed according to the departmental dose-optimization program which includes automated exposure control, adjustment of the mA and/or kV according to patient size and/or use of iterative reconstruction technique. COMPARISON:  None Available. FINDINGS: Brain: No intracranial hemorrhage, mass effect, or midline shift. No hydrocephalus. The basilar cisterns are patent. No evidence of territorial infarct or acute ischemia. No extra-axial or intracranial fluid collection. Vascular: Atherosclerosis of skullbase vasculature without hyperdense vessel or abnormal calcification. Skull: No fracture or focal lesion. Sinuses/Orbits: Paranasal sinuses and mastoid air cells are clear. The visualized orbits are unremarkable. Other: None. IMPRESSION: No acute intracranial abnormality. Electronically Signed   By: Keith Rake M.D.   On: 03/08/2022 18:34     Consults:  Case discussed  with Dr. Malen Gauze of neurology.   Final Assessment and Plan:   Patient has an ABCD score of 2  Patient with an elevated ABCD score.  I recommended admission for further emergent diagnostics, further care and management.  His localizing signs and symptoms raise high concern for TIA event.  Even with a negative MRI, informed the patient that a small number of TIAs occur without evidence on  MRI. Patient's description of symptoms do localize.  I consulted neurology who agree that the patient likely needs inpatient care and management for further immediate diagnostic and management. However, patient stated that he needed to leave as soon as possible.  He states he is the sole caregiver for his cognitively impaired wife and he is already worried about her being at home alone.  He declined hospital admission and stated understanding of risk of stroke being substantially elevated because of the events today but stated he would follow-up with a neurologist in the outpatient setting and work around the schedule of his wife.  I recommended he find care for his wife and return immediately for admission for further care and management but patient stated that he could not complete this and he would follow-up outpatient.  Expressed again understanding of risk of stroke but still declined inpatient admission and further workup.  Informed neurology of patient's request for outpatient care who recommended referral to [facility] neurology for close outpatient care management. Disposition:  I have considered need for hospitalization, however, considering all of the above, I believe this patient is stable for discharge at this time.  Patient/family educated about specific return precautions for given chief complaint and symptoms.  Patient/family educated about follow-up with PCP/Neurology.     Patient/family expressed understanding of return precautions and need for follow-up. Patient spoken to regarding all imaging and laboratory results and appropriate follow up for these results. All education provided in verbal form with additional information in written form. Time was allowed for answering of patient questions. Patient discharged.    Emergency Department Medication Summary:   Medications  sodium chloride flush (NS) 0.9 % injection 3 mL (has no administration in time range)         Clinical  Impression:  1. Visual disturbance   2. Transient ischemic attack (TIA)      Data Unavailable   Final Clinical Impression(s) / ED Diagnoses Final diagnoses:  Visual disturbance  Transient ischemic attack (TIA)    Rx / DC Orders ED Discharge Orders          Ordered    Ambulatory referral to Neurology       Comments: An appointment is requested in approximately: 1 week   03/08/22 2037              Tretha Sciara, MD 03/08/22 2045

## 2022-03-08 NOTE — ED Triage Notes (Addendum)
Pt presents with c/o visual field change that occurred around 2 pm today. Pt reports he had a flash in front of his face that looked like a zigzag pattern. Pt denies blurred vision at this time and reports that the visual change has since passed. Pt reports he was diagnosed with an aortic aneurysm in January, has been following this with his PCP.

## 2022-03-12 ENCOUNTER — Telehealth: Payer: Self-pay

## 2022-03-12 NOTE — Telephone Encounter (Signed)
error 

## 2022-03-29 ENCOUNTER — Encounter: Payer: Self-pay | Admitting: Neurology

## 2022-03-29 ENCOUNTER — Ambulatory Visit: Payer: BC Managed Care – PPO | Admitting: Neurology

## 2022-03-29 ENCOUNTER — Other Ambulatory Visit: Payer: Self-pay | Admitting: Neurology

## 2022-03-29 ENCOUNTER — Telehealth: Payer: Self-pay | Admitting: Neurology

## 2022-03-29 VITALS — BP 164/94 | HR 58 | Ht 70.0 in | Wt 231.2 lb

## 2022-03-29 DIAGNOSIS — G459 Transient cerebral ischemic attack, unspecified: Secondary | ICD-10-CM | POA: Diagnosis not present

## 2022-03-29 DIAGNOSIS — R0683 Snoring: Secondary | ICD-10-CM

## 2022-03-29 DIAGNOSIS — I4891 Unspecified atrial fibrillation: Secondary | ICD-10-CM

## 2022-03-29 DIAGNOSIS — G4719 Other hypersomnia: Secondary | ICD-10-CM

## 2022-03-29 MED ORDER — ASPIRIN 81 MG PO TBEC: 81.0000 mg | DELAYED_RELEASE_TABLET | Freq: Every day | ORAL | 12 refills | Status: DC

## 2022-03-29 NOTE — Patient Instructions (Addendum)
CTA of the head and neck Echocardiogram of the heart 30-day holter monitor to look for atrial fibrillation and then consider loop recorder that is a small implant that records heart for up to 3 years Needs < 70 LDL (f/u with Dr. Harrington Challenger) - check cholesterol panel HgbA1c < 6 (f/u with Dr. Harrington Challenger) - check hgba1c Risk for sleep apnea: snores, excessive fatigue, TIA, obese, wakes with dry mouth, can fall asleep: sleep study Aspin 81mg  for stroke protection Ask Dr. April Manson to order formal neurocognitive testing - Dr Alphonzo Severance    Transient Ischemic Attack A transient ischemic attack (TIA) happens when blood supply to the brain is blocked temporarily. A TIA causes stroke-like symptoms that go away quickly without causing any permanent damage. Having a TIA can be considered a warning sign for a stroke and should not be ignored. A person who has a TIA is at higher risk for a stroke. What are the causes? This condition is caused by a temporary blockage in an artery in the head or neck. This means the brain does not get the blood supply it needs. A blockage can be caused by: Fatty buildup in an artery in the head or neck (atherosclerosis). A blood clot traveling from the heart. An artery tear (dissection). Inflammation of an artery (vasculitis). Sometimes the cause is not known. What increases the risk? Certain factors make you more likely to develop this condition. Some of these are things you can change, including: Using products that contain nicotine or tobacco. Being inactive. Heavy alcohol use. Drug use, especially cocaine and methamphetamine. Medical conditions that may increase your risk include: High blood pressure (hypertension). High cholesterol. Diabetes. Heart disease (coronary artery disease). An irregular heartbeat, also called atrial fibrillation (AFib). Sickle cell disease. Blood clotting disorders (hypercoagulable state). Other risk factors include: Being over the age of  42. Being male. Obesity. Sleep problems such as sleep apnea. Family history of stroke. Previous history of blood clots, stroke, TIA, or heart attack. What are the signs or symptoms? Symptoms of a TIA are the same as those of a stroke. The symptoms develop suddenly, and then go away quickly. They may include: Dizziness, loss of balance and coordination, or trouble walking. Vision changes, such as double vision, blurred vision, or loss of vision. Weakness or numbness in your face, arm, or leg, especially on one side of your body. Trouble speaking, understanding speech, or both (aphasia). Nausea and vomiting. Severe headache. Confusion. If possible, note what time your symptoms started. Tell your health care provider. How is this diagnosed? This condition may be diagnosed based on: Your symptoms and medical history. A physical exam. Imaging tests, usually a CT scan or MRI of the brain. Blood tests. You may also have other tests, including: Electrocardiogram (ECG). Echocardiogram. Continuous heart monitoring. Carotid ultrasound. A scan of blood circulation in the brain (CT angiogram or MR angiogram). How is this treated? The goal of treatment is to reduce the risk for a stroke. Stroke prevention therapies may include: Changes to diet and lifestyle, such as being physically active and stopping smoking. Treating other health conditions, such as diabetes or AFib. Medicines to thin the blood (antiplatelets or anticoagulants). Blood pressure medicines. Medicines to reduce cholesterol. If testing shows a narrowing in the arteries to your brain, your health care provider may recommend a procedure, such as: Carotid endarterectomy. This is done to remove the blockage from your artery. Carotid angioplasty and stenting. This uses a small mesh tube (stent) to open or widen an  artery in the neck. The stent helps keep the artery open by supporting the artery walls. Follow these instructions at  home: Medicines Take over-the-counter and prescription medicines only as told by your health care provider. If you were told to take a medicine to thin your blood, such as aspirin or an anticoagulant, use it exactly as told by your health care provider. Taking too much blood-thinning medicine can cause bleeding. Taking too little will not protect you against a stroke and other problems. Eating and drinking  Eat 5 or more servings of fruits and vegetables each day. Follow guidelines from your health care provider about your diet. You may need to follow a certain diet to help manage risk factors for stroke. This may include: Eating a low-fat, low-salt diet. Choosing high-fiber foods. Limiting carbohydrates and sugar. If you drink alcohol: Limit how much you have to: 0-1 drink a day for women who are not pregnant. 0-2 drinks a day for men. Know how much alcohol is in your drink. In the [place] one drink equals one 12 oz bottle of beer (355 mL), one 5 oz glass of wine (148 mL), or one 1 oz glass of hard liquor (44 mL). General instructions Maintain a healthy weight. Try to get at least 30 minutes of exercise on most days. Get treatment if you have sleep apnea. Do not use any products that contain nicotine or tobacco. These products include cigarettes, chewing tobacco, and vaping devices, such as e-cigarettes. If you need help quitting, ask your health care provider. Do not use illegal drugs. Keep all follow-up visits. Your health care provider will want to know if you have any more symptoms and to check blood labs if any medicines were prescribed. Where to find more information American Stroke Association: stroke.org Get help right away if: You have chest pain. You have fast or irregular heartbeats (palpitations). You have any symptoms of a stroke. "BE FAST" is an easy way to remember the main warning signs of a stroke. B - Balance. Signs are dizziness, sudden trouble walking, or loss of  balance. E - Eyes. Signs are trouble seeing or a sudden change in vision. F - Face. Signs are sudden weakness or numbness of the face, or the face or eyelid drooping on one side. A - Arms. Signs are weakness or numbness in an arm. This happens suddenly and usually on one side of the body. S - Speech.Signs are sudden trouble speaking, slurred speech, or trouble understanding what people say. T - Time. Time to call emergency services. Write down what time symptoms started. You have other signs of a stroke, such as: A sudden, severe headache with no known cause. Nausea or vomiting. Seizure. These symptoms may be an emergency. Get help right away. Call 911. Do not wait to see if the symptoms will go away. Do not drive yourself to the hospital. This information is not intended to replace advice given to you by your health care provider. Make sure you discuss any questions you have with your health care provider.  Atrial Fibrillation Atrial fibrillation (AFib) is a type of heartbeat that is irregular or fast. If you have AFib, your heart beats without any order. This makes it hard for your heart to pump blood in a normal way. AFib may come and go, or it may become a long-lasting problem. If AFib is not treated, it can put you at higher risk for stroke, heart failure, and other heart problems. What are the causes? AFib may  be caused by diseases that damage the heart's electrical system. They include: High blood pressure. Heart failure. Heart valve diseases. Heart surgery. Diabetes. Thyroid disease. Kidney disease. Lung diseases, such as pneumonia or COPD. Sleep apnea. Sometimes the cause is not known. What increases the risk? You are more likely to develop AFib if: You are older. You exercise often and very hard. You have a family history of AFib. You are male. You are Caucasian. You are overweight. You smoke. You drink a lot of alcohol. What are the signs or symptoms? Common  symptoms of this condition include: A feeling that your heart is beating very fast. Chest pain or discomfort. Feeling short of breath. Suddenly feeling light-headed or weak. Getting tired easily during activity. Fainting. Sweating. In some cases, there are no symptoms. How is this treated? Medicines to: Prevent blood clots. Treat heart rate or heart rhythm problems. Using devices, such as a pacemaker, to correct heart rhythm problems. Doing surgery to remove the part of the heart that sends bad signals. Closing an area where clots can form in the heart (left atrial appendage). In some cases, your doctor will treat other underlying conditions. Follow these instructions at home: Medicines Take over-the-counter and prescription medicines only as told by your doctor. Do not take any new medicines without first talking to your doctor. If you are taking blood thinners: Talk with your doctor before taking aspirin or NSAIDs, such as ibuprofen. Take your medicines as told. Take them at the same time each day. Do not do things that could hurt or bruise you. Be careful to avoid falls. Wear an alert bracelet or carry a card that says you take blood thinners. Lifestyle Do not smoke or use any products that contain nicotine or tobacco. If you need help quitting, ask your doctor. Eat heart-healthy foods. Talk with your doctor about the right eating plan for you. Exercise regularly as told by your doctor. Do not drink alcohol. Lose weight if you are overweight. General instructions If you have sleep apnea, treat it as told by your doctor. Do not use diet pills unless your doctor says they are safe for you. Diet pills may make heart problems worse. Keep all follow-up visits. Your doctor will check your heart rate and rhythm regularly. Contact a doctor if: You notice a change in the speed, rhythm, or strength of your heartbeat. You are taking a blood-thinning medicine and you get more  bruising. You get tired more easily when you move or exercise. You have a sudden change in weight. Get help right away if:  You have pain in your chest. You have trouble breathing. You have side effects of blood thinners, such as blood in your vomit, poop (stool), or pee (urine), or bleeding that cannot stop. You have any signs of a stroke. "BE FAST" is an easy way to remember the main warning signs: B - Balance. Dizziness, sudden trouble walking, or loss of balance. E - Eyes. Trouble seeing or a change in how you see. F - Face. Sudden weakness or loss of feeling in the face. The face or eyelid may droop on one side. A - Arms.Weakness or loss of feeling in an arm. This happens suddenly and usually on one side of the body. S - Speech. Sudden trouble speaking, slurred speech, or trouble understanding what people say. T - Time.Time to call emergency services. Write down what time symptoms started. You have other signs of a stroke, such as: A sudden, very bad headache with  no known cause. Feeling like you may vomit (nausea). Vomiting. A seizure. These symptoms may be an emergency. Get help right away. Call 911. Do not wait to see if the symptoms will go away. Do not drive yourself to the hospital. This information is not intended to replace advice given to you by your health care provider. Make sure you discuss any questions you have with your health care provider. Document Revised: 09/06/2021 Document Reviewed: 09/06/2021 Elsevier Patient Education  [place]. Sleep Apnea Sleep apnea is a condition in which breathing pauses or becomes shallow during sleep. People with sleep apnea usually snore loudly. They may have times when they gasp and stop breathing for 10 seconds or more during sleep. This may happen many times during the night. Sleep apnea disrupts your sleep and keeps your body from getting the rest that it needs. This condition can increase your risk of certain health  problems, including: Heart attack. Stroke. Obesity. Type 2 diabetes. Heart failure. Irregular heartbeat. High blood pressure. The goal of treatment is to help you breathe normally again. What are the causes?  The most common cause of sleep apnea is a collapsed or blocked airway. There are three kinds of sleep apnea: Obstructive sleep apnea. This kind is caused by a blocked or collapsed airway. Central sleep apnea. This kind happens when the part of the brain that controls breathing does not send the correct signals to the muscles that control breathing. Mixed sleep apnea. This is a combination of obstructive and central sleep apnea. What increases the risk? You are more likely to develop this condition if you: Are overweight. Smoke. Have a smaller than normal airway. Are older. Are male. Drink alcohol. Take sedatives or tranquilizers. Have a family history of sleep apnea. Have a tongue or tonsils that are larger than normal. What are the signs or symptoms? Symptoms of this condition include: Trouble staying asleep. Loud snoring. Morning headaches. Waking up gasping. Dry mouth or sore throat in the morning. Daytime sleepiness and tiredness. If you have daytime fatigue because of sleep apnea, you may be more likely to have: Trouble concentrating. Forgetfulness. Irritability or mood swings. Personality changes. Feelings of depression. Sexual dysfunction. This may include loss of interest if you are male, or erectile dysfunction if you are male. How is this diagnosed? This condition may be diagnosed with: A medical history. A physical exam. A series of tests that are done while you are sleeping (sleep study). These tests are usually done in a sleep lab, but they may also be done at home. How is this treated? Treatment for this condition aims to restore normal breathing and to ease symptoms during sleep. It may involve managing health issues that can affect breathing,  such as high blood pressure or obesity. Treatment may include: Sleeping on your side. Using a decongestant if you have nasal congestion. Avoiding the use of depressants, including alcohol, sedatives, and narcotics. Losing weight if you are overweight. Making changes to your diet. Quitting smoking. Using a device to open your airway while you sleep, such as: An oral appliance. This is a custom-made mouthpiece that shifts your lower jaw forward. A continuous positive airway pressure (CPAP) device. This device blows air through a mask when you breathe out (exhale). A nasal expiratory positive airway pressure (EPAP) device. This device has valves that you put into each nostril. A bi-level positive airway pressure (BIPAP) device. This device blows air through a mask when you breathe in (inhale) and breathe out (exhale).  Having surgery if other treatments do not work. During surgery, excess tissue is removed to create a wider airway. Follow these instructions at home: Lifestyle Make any lifestyle changes that your health care provider recommends. Eat a healthy, well-balanced diet. Take steps to lose weight if you are overweight. Avoid using depressants, including alcohol, sedatives, and narcotics. Do not use any products that contain nicotine or tobacco. These products include cigarettes, chewing tobacco, and vaping devices, such as e-cigarettes. If you need help quitting, ask your health care provider. General instructions Take over-the-counter and prescription medicines only as told by your health care provider. If you were given a device to open your airway while you sleep, use it only as told by your health care provider. If you are having surgery, make sure to tell your health care provider you have sleep apnea. You may need to bring your device with you. Keep all follow-up visits. This is important. Contact a health care provider if: The device that you received to open your airway during  sleep is uncomfortable or does not seem to be working. Your symptoms do not improve. Your symptoms get worse. Get help right away if: You develop: Chest pain. Shortness of breath. Discomfort in your back, arms, or stomach. You have: Trouble speaking. Weakness on one side of your body. Drooping in your face. These symptoms may represent a serious problem that is an emergency. Do not wait to see if the symptoms will go away. Get medical help right away. Call your local emergency services (911 in the [place] Do not drive yourself to the hospital. Summary Sleep apnea is a condition in which breathing pauses or becomes shallow during sleep. The most common cause is a collapsed or blocked airway. The goal of treatment is to restore normal breathing and to ease symptoms during sleep. This information is not intended to replace advice given to you by your health care provider. Make sure you discuss any questions you have with your health care provider. Document Revised: 07/27/2020 Document Reviewed: 11/27/2019 Elsevier Patient Education  [place].  Document Revised: 06/02/2021 Document Reviewed: 06/02/2021 Elsevier Patient Education  [place].

## 2022-03-29 NOTE — Progress Notes (Addendum)
[facility]    Provider:  Dr Jaynee Eagles Requesting Provider: Tretha Sciara, MD Primary Care Provider:  Lawerance Cruel, MD  CC:  ED referral for TIA  Mychart to pcp and patient 04/02/2022: Dr. Harrington Challenger, Joseph Yang cholesterol is awful. His ldl is 176. I'm sure you know this because it sounds like he refuses statins. I told him this is a stroke risk factor and he should treat it fyi thanks Dr. Jaynee Eagles  HPI:  Joseph Yang is a 77 y.o. male here as requested by Tretha Sciara, MD for ED follow up for migraine vs TIA. 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  History from review of chart as well as patient.  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  Symptoms lasted 15 minutes before resolving spontaneously.  Exam was nonfocal in the emergency room.  He does have multiple comorbid medical problems.  He was currently asymptomatic.  Neurologic exam was also normal.  CT of the head, MRI of the head showed nothing acute.  Screening labs, EKG, all reassuring.  Patient declined admission.  Patient think it was a migraine with aura. He has had migraines for 20 years but very infrequent. Daggers in his eyes, severe, he hasn;t had one in years. He was blinded, he saw a patient, crescent-shape pattern, burred around the eyes, was in both eyes and when he closed his eyes he still saw  the patterns. Lasted 15 minutes, felt cognitively impaired and then right-sided slight weakness, had difficulty speaking   Reviewed notes, labs and imaging from outside physicians, which showed:  MRI brain: EXAM: MRI HEAD WITHOUT CONTRAST   TECHNIQUE: Multiplanar, multiecho pulse sequences of the brain and surrounding structures were obtained without intravenous contrast.   COMPARISON:  CT Head 03/08/22   FINDINGS: Brain: Negative for acute infarct. No hydrocephalus. No extra-axial fluid collection. No significant microvascular ischemic change. No hemorrhage. No midline shift   Vascular: Normal flow voids.   Skull and upper cervical spine: Normal marrow signal.   Sinuses/Orbits: Trace right mastoid effusion. No middle ear effusion. Paranasal sinuses are grossly clear. Orbits are unremarkable.   Other: None.   IMPRESSION: No acute abnormality.  No finding to explain vision changes.      Latest Ref Rng & Units 03/29/2022   11:06 AM 03/08/2022    4:47 PM  CMP  Glucose 70 - 99 mg/dL 100  91   BUN 8 - 27 mg/dL 13  15   Creatinine 0.76 - 1.27 mg/dL 1.21  1.37   Sodium 134 - 144 mmol/L 142  137   Potassium 3.5 - 5.2 mmol/L 4.8  4.0   Chloride 96 - 106 mmol/L 104  105   CO2 20 - 29 mmol/L 22  21   Calcium 8.6 - 10.2 mg/dL 9.3  9.1   Total Protein 6.5 - 8.1 g/dL  7.7   Total Bilirubin 0.3 - 1.2 mg/dL  3.1   Alkaline Phos 38 - 126  U/L  61   AST 15 - 41 U/L  21   ALT 0 - 44 U/L  18       Latest Ref Rng & Units 03/08/2022    4:47 PM 06/16/2010    7:12 PM 06/12/2010    3:00 PM  CBC  WBC 4.0 - 10.5 K/uL 4.8  6.8  4.2   Hemoglobin 13.0 - 17.0 g/dL 17.0  15.3  15.3   Hematocrit 39.0 - 52.0 % 50.1  45.3  44.6   Platelets 150 - 400 K/uL 170  178  185       Review of Systems: Patient complains of symptoms per HPI as well as the following symptoms tia. Pertinent negatives and positives per HPI. All others negative.   Social History   Socioeconomic History   Marital status:  Married    Spouse name: Not on file   Number of children: Not on file   Years of education: Not on file   Highest education level: Not on file  Occupational History   Not on file  Tobacco Use   Smoking status: Never   Smokeless tobacco: Never  Substance and Sexual Activity   Alcohol use: Not Currently   Drug use: Not on file   Sexual activity: Not on file  Other Topics Concern   Not on file  Social History Narrative   Teaches High school   Social Determinants of Health   Financial Resource Strain: Not on file  Food Insecurity: Not on file  Transportation Needs: Not on file  Physical Activity: Not on file  Stress: Not on file  Social Connections: Not on file  Intimate Partner Violence: Not on file    Family History  Problem Relation Age of Onset   Lymphoma Father    Cancer Father    Cancer Paternal Grandfather    Migraines Neg Hx     Past Medical History:  Diagnosis Date   Enlarged prostate    Migraine    Thyroid disease     Patient Active Problem List   Diagnosis Date Noted   Pain in right knee 08/13/2017   Elevated blood pressure reading in office without diagnosis of hypertension 01/30/2017   Positive ANA (antinuclear antibody) 01/30/2017   CKD (chronic kidney disease) stage 2, GFR 60-89 ml/min 12/26/2016   Graves disease 12/26/2016   Microscopic hematuria 12/26/2016   NSAID long-term use 12/26/2016   Postablative hypothyroidism 12/26/2016   Cough variant asthma 08/16/2015   Post-nasal drip 08/16/2015   Chronic cough 06/24/2015   Precordial pain 06/24/2015   HYPERLIPIDEMIA 07/12/2009   MIGRAINE, CHRONIC 07/12/2009   DYSPNEA 07/12/2009   PROSTATITIS, HX OF 07/12/2009   INGUINAL HERNIA, RIGHT, HX OF 07/12/2009    Past Surgical History:  Procedure Laterality Date   colonscopy     has had polyps removed. (precancerous)   HERNIA REPAIR     thyroid ablation      Current Outpatient Medications  Medication Sig Dispense Refill   acetaminophen  (TYLENOL) 500 MG tablet Take 500 mg by mouth every 6 (six) hours as needed.     aspirin EC 81 MG tablet Take 1 tablet (81 mg total) by mouth daily. Swallow whole. 30 tablet 12   Cholecalciferol (VITAMIN D3) 5000 units CAPS Take 1 capsule by mouth 2 (two) times a week.     levothyroxine (SYNTHROID, LEVOTHROID) 125 MCG tablet Take 125 mcg by mouth daily.       lidocaine (LIDODERM) 5 % SMARTSIG:1-2 Patch(s) Topical  Daily     LORazepam (ATIVAN) 1 MG tablet lorazepam 1 mg tablet     vitamin B-12 (CYANOCOBALAMIN) 500 MCG tablet Take 500 mcg by mouth daily.     No current facility-administered medications for this visit.    Allergies as of 03/29/2022 - Review Complete 03/29/2022  Allergen Reaction Noted   Lidocaine Anaphylaxis and Other (See Comments) 12/14/2016   Pseudoephedrine Anaphylaxis 12/14/2016   Pseudoephedrine hcl Other (See Comments) 07/02/2019    Vitals: BP (!) 164/94 (BP Location: Left Arm)   Pulse (!) 58   Ht 5\' 10"  (1.778 m)   Wt 231 lb 3.2 oz (104.9 kg)   BMI 33.17 kg/m  Last Weight:  Wt Readings from Last 1 Encounters:  03/29/22 231 lb 3.2 oz (104.9 kg)   Last Height:   Ht Readings from Last 1 Encounters:  03/29/22 5\' 10"  (1.778 m)     Exam: NAD, pleasant                  Speech:    Speech is normal; fluent and spontaneous with normal comprehension.  Cognition:    The patient is oriented to person, place, and time;     recent and remote memory intact;     language fluent;    Cranial Nerves:    The pupils are equal, round, and reactive to light.Trigeminal sensation is intact and the muscles of mastication are normal. The face is symmetric. The palate elevates in the midline. Hearing intact. Voice is normal. Shoulder shrug is normal. The tongue has normal motion without fasciculations.   Coordination:  No dysmetria  Motor Observation:    No asymmetry, no atrophy, and no involuntary movements noted. Tone:    Normal muscle tone.     Strength:    Strength  is V/V in the upper and lower limbs.      Sensation: intact to LT    Assessment/Plan:  Joseph Yang is a 77 y.o. male here as requested by Tretha Sciara, MD for ED follow up for migraine vs TIA. 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  Mychart to pcp and patient 04/02/2022: Dr. Harrington Challenger, Joseph Yang cholesterol is awful. His ldl is 176. I'm sure you know this because it sounds like he refuses statins. I told him this is a stroke risk factor and he should treat it fyi thanks Dr. Jaynee Eagles (he declined statins during appt with me)  CTA of the head and neck Echocardiogram of the heart 30-day holter monitor to look for atrial fibrillation and then consider loop recorder that is a small implant that records heart for up to 3 years Needs < 70 LDL (f/u with Dr. Harrington Challenger) - check cholesterol panel HgbA1c < 6 (f/u with Dr. Harrington Challenger) - check hgba1c Risk for sleep apnea: snores, excessive fatigue, TIA, obese, wakes with dry mouth, can fall asleep: sleep study Aspin 81mg  for stroke protection Ask Dr. April Manson to order formal neurocognitive testing   Orders Placed This Encounter  Procedures   CT ANGIO HEAD W OR WO CONTRAST   CT ANGIO NECK W OR WO CONTRAST   Hemoglobin A1c   Lipid Panel   Basic Metabolic Panel  Ambulatory referral to Sleep Studies   ECHOCARDIOGRAM COMPLETE BUBBLE STUDY   Meds ordered this encounter  Medications   aspirin EC 81 MG tablet    Sig: Take 1 tablet (81 mg total) by mouth daily. Swallow whole.    Dispense:  30 tablet    Refill:  12    Cc: Tretha Sciara, MD,  Lawerance Cruel, MD  Sarina Ill, MD  [facility] [address] [place], [place] [address]  Phone [phone] Fax [phone]  I spent over 40 minutes of face-to-face and non-face-to-face time with patient on the  1. TIA (transient ischemic attack)   2. Snoring   3. Excessive daytime sleepiness    diagnosis.  This included previsit chart review, lab review, study review, order entry, electronic health record documentation, patient education on the different diagnostic and therapeutic options, counseling and coordination of care, risks and benefits of management, compliance, or risk factor reduction

## 2022-03-29 NOTE — Telephone Encounter (Signed)
Joseph Yang: [id number] exp. 03/29/22-04/27/22 sent to GI [id number]

## 2022-03-30 LAB — BASIC METABOLIC PANEL
BUN/Creatinine Ratio: 11 (ref 10–24)
BUN: 13 mg/dL (ref 8–27)
CO2: 22 mmol/L (ref 20–29)
Calcium: 9.3 mg/dL (ref 8.6–10.2)
Chloride: 104 mmol/L (ref 96–106)
Creatinine, Ser: 1.21 mg/dL (ref 0.76–1.27)
Glucose: 100 mg/dL — ABNORMAL HIGH (ref 70–99)
Potassium: 4.8 mmol/L (ref 3.5–5.2)
Sodium: 142 mmol/L (ref 134–144)
eGFR: 63 mL/min/{1.73_m2} (ref >59)

## 2022-03-30 LAB — LIPID PANEL
Chol/HDL Ratio: 5.8 ratio — ABNORMAL HIGH (ref 0.0–5.0)
Cholesterol, Total: 263 mg/dL — ABNORMAL HIGH (ref 100–199)
HDL: 45 mg/dL (ref >39)
LDL Chol Calc (NIH): 176 mg/dL — ABNORMAL HIGH (ref 0–99)
Triglycerides: 224 mg/dL — ABNORMAL HIGH (ref 0–149)
VLDL Cholesterol Cal: 42 mg/dL — ABNORMAL HIGH (ref 5–40)

## 2022-03-30 LAB — HEMOGLOBIN A1C
Est. average glucose Bld gHb Est-mCnc: 103 mg/dL
Hgb A1c MFr Bld: 5.2 % (ref 4.8–5.6)

## 2022-04-02 ENCOUNTER — Ambulatory Visit: Payer: BC Managed Care – PPO | Attending: Neurology

## 2022-04-02 ENCOUNTER — Telehealth: Payer: Self-pay | Admitting: *Deleted

## 2022-04-02 DIAGNOSIS — G459 Transient cerebral ischemic attack, unspecified: Secondary | ICD-10-CM | POA: Diagnosis not present

## 2022-04-02 DIAGNOSIS — I4891 Unspecified atrial fibrillation: Secondary | ICD-10-CM | POA: Diagnosis not present

## 2022-04-02 NOTE — Telephone Encounter (Signed)
Spoke with Dr Jaynee Eagles. CT angio is with contrast. I called pt and let him know. He verbalized understanding and his questions were answered. He also mentioned that he had memory concerns he wanted to address with Dr Jaynee Eagles. I let him know she was going to ask Dr April Manson to order formal memory testing for the patient. He states he was concerned about his job and that in regards to OTC medications for focus/memory he would "start trying things" if he doesn't get answers. He mentioned that his wife, who is our patient, takes adderall. I told him that we do not prescribe that for her and if he wanted to see an attention specialist he could ask his PCP for a referral. I told him I would follow-up on the request for referral to memory testing. He also mentioned that he didn't get to talk to Dr Jaynee Eagles at the office visit about his feelings of dizziness when he stands up/gets out of the car. I did advise him to call Dr Harrington Challenger (PCP) about this for evaluation because it could possibly be blood pressure related. I told him if he has any sudden onset of dizziness, nausea, etc or any severe symptoms he should call 911 for evaluation. He verbalized understanding. Patient asked multiple questions during the call and I addressed them to the best of my ability. We also discussed his wife's pet amyloid brain scan (he is on her DPR) and I charted that portion of the call in her chart.

## 2022-04-02 NOTE — Telephone Encounter (Signed)
Pt called has a question about his ct orders. Please call [phone]

## 2022-04-02 NOTE — Telephone Encounter (Signed)
Spoke with patient. He states GI called him to schedule the CT angio and stated he was told the order says to administer with contrast even though he had discussed with Dr Jaynee Eagles that it may or may not have it. The order does state with or without contrast. Patient doesn't want contrast unless its absolutely necessary because he does have CKD. He would like follow-up and clarification on this.

## 2022-04-09 ENCOUNTER — Telehealth: Payer: Self-pay | Admitting: *Deleted

## 2022-04-09 NOTE — Telephone Encounter (Signed)
Spoke to patient gave labwork results Gave Dr. Trevor Mace recommendation to call PCP ASAP due to labwork results  and make appointment to discuss options for Cholesterol   Pt states understanding and thanked me for calling. Sent results to PCP this afternoon .

## 2022-04-09 NOTE — Telephone Encounter (Signed)
-----   Message from Anson Fret, MD sent at 04/08/2022 11:32 AM EDT ----- He did not read his tests. Please call. Mr. Lehouillier, your cholesterol is really terrible. LDL 176 is elevated and a stroke risk factor. I recommend you follow up with primary care to discuss options. Thanks, Dr. Lucia Gaskins

## 2022-04-09 NOTE — Telephone Encounter (Signed)
LVM for patient to call back to discuss lab work results

## 2022-04-12 ENCOUNTER — Telehealth: Payer: Self-pay | Admitting: Neurology

## 2022-04-12 NOTE — Telephone Encounter (Signed)
Pt states his insurance company gave approval for the Echo Bubble from 03-28 thru 04-26 if it has to be done outside of that time documentation of pt's medical information would need to be sent to insurance company.  Pt is asking for a call to discuss this.Pt is a Runner, broadcasting/film/video, he is asking to be called anytime after 3:00

## 2022-04-13 ENCOUNTER — Encounter: Payer: Self-pay | Admitting: Neurology

## 2022-04-17 NOTE — Telephone Encounter (Signed)
This is for his CT scans scheduled at GI on June 13. I will have to get new authorizations towards the end of May since they only last for a month. I replied to the patient's mychart message about this.

## 2022-05-09 ENCOUNTER — Encounter: Payer: Self-pay | Admitting: Neurology

## 2022-05-17 NOTE — Telephone Encounter (Signed)
[employer] Berkley Harvey: [id number] exp. 05/17/22-06/15/22 for GI

## 2022-06-06 ENCOUNTER — Telehealth: Payer: Self-pay | Admitting: Neurology

## 2022-06-06 NOTE — Telephone Encounter (Signed)
I called the patient's insurance company and they said that he is not enrolled for the cardiology part of his plan which means the echo CPT code 47829 does not require a prior authorization. Reference # Terrill J. 06/06/22

## 2022-06-13 ENCOUNTER — Ambulatory Visit (HOSPITAL_COMMUNITY): Payer: BC Managed Care – PPO | Attending: Neurology

## 2022-06-13 DIAGNOSIS — G459 Transient cerebral ischemic attack, unspecified: Secondary | ICD-10-CM | POA: Diagnosis not present

## 2022-06-13 LAB — ECHOCARDIOGRAM COMPLETE BUBBLE STUDY
Area-P 1/2: 3.53 cm2
S' Lateral: 3.1 cm

## 2022-06-14 ENCOUNTER — Ambulatory Visit
Admission: RE | Admit: 2022-06-14 | Discharge: 2022-06-14 | Disposition: A | Discharge status: Home / Self Care | Payer: BC Managed Care – PPO | Source: Ambulatory Visit | Attending: Neurology | Admitting: Neurology

## 2022-06-14 ENCOUNTER — Other Ambulatory Visit: Payer: Self-pay | Admitting: Neurology

## 2022-06-14 ENCOUNTER — Institutional Professional Consult (permissible substitution): Payer: BC Managed Care – PPO | Admitting: Neurology

## 2022-06-14 DIAGNOSIS — G459 Transient cerebral ischemic attack, unspecified: Secondary | ICD-10-CM

## 2022-06-14 DIAGNOSIS — G4719 Other hypersomnia: Secondary | ICD-10-CM

## 2022-06-14 DIAGNOSIS — R0683 Snoring: Secondary | ICD-10-CM

## 2022-06-14 MED ORDER — IOPAMIDOL (ISOVUE-370) INJECTION 76%
60.0000 mL | Freq: Once | INTRAVENOUS | Status: AC | PRN
  Administered 2022-06-14: 60 mL via INTRAVENOUS

## 2022-06-20 ENCOUNTER — Ambulatory Visit: Payer: BC Managed Care – PPO | Admitting: Neurology

## 2022-06-23 ENCOUNTER — Telehealth: Payer: Self-pay | Admitting: Neurology

## 2022-06-23 ENCOUNTER — Encounter: Payer: Self-pay | Admitting: Neurology

## 2022-06-23 NOTE — Telephone Encounter (Signed)
Please ask him if he is ok if I order a CT of the chest per radiology recommendations below?  1. Mild enlargement of lymph nodes in the upper mediastinum, with no  indication of adenopathy on cardiac CT from 2023. Recommend chest CT  follow-up.  2. No emergent vascular finding.  3. There is atherosclerosis without significant stenosis or  irregularity of major arteries in the head and neck.

## 2022-06-25 NOTE — Telephone Encounter (Signed)
Pt had messaged Dr Lucia Gaskins back in [place] and said ok to order CT chest. The message is in her queue to address.

## 2022-06-26 ENCOUNTER — Ambulatory Visit: Payer: BC Managed Care – PPO | Admitting: Neurology

## 2022-06-26 NOTE — Progress Notes (Deleted)
[facility]    Provider:  Dr Lucia Gaskins Requesting Provider: Daisy Floro, MD Primary Care Provider:  Daisy Floro, MD  CC:  ED referral for TIA  Mychart to pcp and patient 04/02/2022: Dr. Tenny Craw, Mr. Diliberto cholesterol is awful. His ldl is 176. I'm sure you know this because it sounds like he refuses statins. I told him this is a stroke risk factor and he should treat it fyi thanks Dr. Lucia Gaskins  HPI:  OSMANI KERSTEN is a 77 y.o. male here as requested by Daisy Floro, MD for ED follow up for migraine vs TIA. 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  History from review of chart as well as patient.  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  Symptoms lasted 15 minutes before resolving spontaneously.  Exam was nonfocal in the emergency room.  He does have multiple comorbid medical problems.  He was currently asymptomatic.  Neurologic exam was also normal.  CT of the head, MRI of the head showed nothing acute.  Screening labs, EKG, all reassuring.  Patient declined admission.  Patient think it was a migraine with aura. He has had migraines for 20 years but very infrequent. Daggers in his eyes, severe, he hasn;t had one in years. He was blinded, he saw a patient, crescent-shape pattern, burred around the eyes, was in both eyes and when he closed his eyes he still  saw the patterns. Lasted 15 minutes, felt cognitively impaired and then right-sided slight weakness, had difficulty speaking   Reviewed notes, labs and imaging from outside physicians, which showed:  MRI brain: EXAM: MRI HEAD WITHOUT CONTRAST   TECHNIQUE: Multiplanar, multiecho pulse sequences of the brain and surrounding structures were obtained without intravenous contrast.   COMPARISON:  CT Head 03/08/22   FINDINGS: Brain: Negative for acute infarct. No hydrocephalus. No extra-axial fluid collection. No significant microvascular ischemic change. No hemorrhage. No midline shift   Vascular: Normal flow voids.   Skull and upper cervical spine: Normal marrow signal.   Sinuses/Orbits: Trace right mastoid effusion. No middle ear effusion. Paranasal sinuses are grossly clear. Orbits are unremarkable.   Other: None.   IMPRESSION: No acute abnormality.  No finding to explain vision changes.      Latest Ref Rng & Units 03/29/2022   11:06 AM 03/08/2022    4:47 PM  CMP  Glucose 70 - 99 mg/dL 213  91   BUN 8 - 27 mg/dL 13  15   Creatinine 0.86 - 1.27 mg/dL 5.78  4.69   Sodium 629 - 144 mmol/L 142  137   Potassium 3.5 - 5.2 mmol/L 4.8  4.0   Chloride 96 - 106 mmol/L 104  105   CO2 20 - 29 mmol/L 22  21   Calcium 8.6 - 10.2 mg/dL 9.3  9.1   Total Protein 6.5 - 8.1 g/dL  7.7   Total Bilirubin 0.3 - 1.2 mg/dL  3.1   Alkaline Phos 38 -  126 U/L  61   AST 15 - 41 U/L  21   ALT 0 - 44 U/L  18       Latest Ref Rng & Units 03/08/2022    4:47 PM 06/16/2010    7:12 PM 06/12/2010    3:00 PM  CBC  WBC 4.0 - 10.5 K/uL 4.8  6.8  4.2   Hemoglobin 13.0 - 17.0 g/dL 63.8  75.6  43.3   Hematocrit 39.0 - 52.0 % 50.1  45.3  44.6   Platelets 150 - 400 K/uL 170  178  185       Review of Systems: Patient complains of symptoms per HPI as well as the following symptoms tia. Pertinent negatives and positives per HPI. All others negative.   Social History   Socioeconomic History   Marital  status: Married    Spouse name: Not on file   Number of children: Not on file   Years of education: Not on file   Highest education level: Not on file  Occupational History   Not on file  Tobacco Use   Smoking status: Never   Smokeless tobacco: Never  Substance and Sexual Activity   Alcohol use: Not Currently   Drug use: Not on file   Sexual activity: Not on file  Other Topics Concern   Not on file  Social History Narrative   Teaches High school   Social Determinants of Health   Financial Resource Strain: Not on file  Food Insecurity: Not on file  Transportation Needs: Not on file  Physical Activity: Not on file  Stress: Not on file  Social Connections: Not on file  Intimate Partner Violence: Not on file    Family History  Problem Relation Age of Onset   Lymphoma Father    Cancer Father    Cancer Paternal Grandfather    Migraines Neg Hx     Past Medical History:  Diagnosis Date   Enlarged prostate    Migraine    Thyroid disease     Patient Active Problem List   Diagnosis Date Noted   Pain in right knee 08/13/2017   Elevated blood pressure reading in office without diagnosis of hypertension 01/30/2017   Positive ANA (antinuclear antibody) 01/30/2017   CKD (chronic kidney disease) stage 2, GFR 60-89 ml/min 12/26/2016   Graves disease 12/26/2016   Microscopic hematuria 12/26/2016   NSAID long-term use 12/26/2016   Postablative hypothyroidism 12/26/2016   Cough variant asthma 08/16/2015   Post-nasal drip 08/16/2015   Chronic cough 06/24/2015   Precordial pain 06/24/2015   HYPERLIPIDEMIA 07/12/2009   MIGRAINE, CHRONIC 07/12/2009   DYSPNEA 07/12/2009   PROSTATITIS, HX OF 07/12/2009   INGUINAL HERNIA, RIGHT, HX OF 07/12/2009    Past Surgical History:  Procedure Laterality Date   colonscopy     has had polyps removed. (precancerous)   HERNIA REPAIR     thyroid ablation      Current Outpatient Medications  Medication Sig Dispense Refill    acetaminophen (TYLENOL) 500 MG tablet Take 500 mg by mouth every 6 (six) hours as needed.     aspirin EC 81 MG tablet Take 1 tablet (81 mg total) by mouth daily. Swallow whole. 30 tablet 12   Cholecalciferol (VITAMIN D3) 5000 units CAPS Take 1 capsule by mouth 2 (two) times a week.     levothyroxine (SYNTHROID, LEVOTHROID) 125 MCG tablet Take 125 mcg by mouth daily.       lidocaine (LIDODERM) 5 % SMARTSIG:1-2 Patch(s)  Topical Daily     LORazepam (ATIVAN) 1 MG tablet lorazepam 1 mg tablet     vitamin B-12 (CYANOCOBALAMIN) 500 MCG tablet Take 500 mcg by mouth daily.     No current facility-administered medications for this visit.    Allergies as of 06/26/2022 - Review Complete 03/29/2022  Allergen Reaction Noted   Lidocaine Anaphylaxis and Other (See Comments) 12/14/2016   Pseudoephedrine Anaphylaxis 12/14/2016   Pseudoephedrine hcl Other (See Comments) 07/02/2019    Vitals: There were no vitals taken for this visit. Last Weight:  Wt Readings from Last 1 Encounters:  03/29/22 231 lb 3.2 oz (104.9 kg)   Last Height:   Ht Readings from Last 1 Encounters:  03/29/22 5\' 10"  (1.778 m)     Exam: NAD, pleasant                  Speech:    Speech is normal; fluent and spontaneous with normal comprehension.  Cognition:    The patient is oriented to person, place, and time;     recent and remote memory intact;     language fluent;    Cranial Nerves:    The pupils are equal, round, and reactive to light.Trigeminal sensation is intact and the muscles of mastication are normal. The face is symmetric. The palate elevates in the midline. Hearing intact. Voice is normal. Shoulder shrug is normal. The tongue has normal motion without fasciculations.   Coordination:  No dysmetria  Motor Observation:    No asymmetry, no atrophy, and no involuntary movements noted. Tone:    Normal muscle tone.     Strength:    Strength is V/V in the upper and lower limbs.      Sensation: intact to  LT    Assessment/Plan:  KEVION FATHEREE is a 77 y.o. male here as requested by Glyn Ade, MD for ED follow up for migraine vs TIA. 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  Mychart to pcp and patient 04/02/2022: Dr. Tenny Craw, Mr. Keys cholesterol is awful. His ldl is 176. I'm sure you know this because it sounds like he refuses statins. I told him this is a stroke risk factor and he should treat it fyi thanks Dr. Lucia Gaskins (he declined statins during appt with me)  CTA of the head and neck Echocardiogram of the heart 30-day holter monitor to look for atrial fibrillation and then consider loop recorder that is a small implant that records heart for up to 3 years Needs < 70 LDL (f/u with Dr. Tenny Craw) - check cholesterol panel HgbA1c < 6 (f/u with Dr. Tenny Craw) - check hgba1c Risk for sleep apnea: snores, excessive fatigue, TIA, obese, wakes with dry mouth, can fall asleep: sleep study Aspin 81mg  for stroke protection Ask Dr. Teresa Coombs to order formal neurocognitive testing   No orders of the defined types were placed in this encounter.  No orders of the defined types were placed in this encounter.   Cc: Daisy Floro, MD,  Daisy Floro, MD  Naomie Dean, MD  [facility] [address] [place], [place] [address]  Phone [phone] Fax [phone]  I spent over 40 minutes  of face-to-face and non-face-to-face time with patient on the  No diagnosis found.  diagnosis.  This included previsit chart review, lab review, study  review, order entry, electronic health record documentation, patient education on the different diagnostic and therapeutic options, counseling and coordination of care, risks and benefits of management, compliance, or risk factor reduction

## 2022-07-02 ENCOUNTER — Encounter: Payer: Self-pay | Admitting: Neurology

## 2022-07-02 ENCOUNTER — Ambulatory Visit: Payer: BC Managed Care – PPO | Admitting: Neurology

## 2022-07-02 VITALS — BP 106/77 | HR 66 | Ht 68.0 in | Wt 220.0 lb

## 2022-07-02 DIAGNOSIS — R590 Localized enlarged lymph nodes: Secondary | ICD-10-CM

## 2022-07-02 DIAGNOSIS — G459 Transient cerebral ischemic attack, unspecified: Secondary | ICD-10-CM

## 2022-07-02 NOTE — Patient Instructions (Signed)
Sleep Apnea Sleep apnea is a condition in which breathing pauses or becomes shallow during sleep. People with sleep apnea usually snore loudly. They may have times when they gasp and stop breathing for 10 seconds or more during sleep. This may happen many times during the night. Sleep apnea disrupts your sleep and keeps your body from getting the rest that it needs. This condition can increase your risk of certain health problems, including: Heart attack. Stroke. Obesity. Type 2 diabetes. Heart failure. Irregular heartbeat. High blood pressure. dementia The goal of treatment is to help you breathe normally again. What are the causes?  The most common cause of sleep apnea is a collapsed or blocked airway. There are three kinds of sleep apnea: Obstructive sleep apnea. This kind is caused by a blocked or collapsed airway. Central sleep apnea. This kind happens when the part of the brain that controls breathing does not send the correct signals to the muscles that control breathing. Mixed sleep apnea. This is a combination of obstructive and central sleep apnea. What increases the risk? You are more likely to develop this condition if you: Are overweight. Smoke. Have a smaller than normal airway. Are older. Are male. Drink alcohol. Take sedatives or tranquilizers. Have a family history of sleep apnea. Have a tongue or tonsils that are larger than normal. What are the signs or symptoms? Symptoms of this condition include: Trouble staying asleep. Loud snoring. Morning headaches. Waking up gasping. Dry mouth or sore throat in the morning. Daytime sleepiness and tiredness. If you have daytime fatigue because of sleep apnea, you may be more likely to have: Trouble concentrating. Forgetfulness. Irritability or mood swings. Personality changes. Feelings of depression. Sexual dysfunction. This may include loss of interest if you are male, or erectile dysfunction if you are  male. How is this diagnosed? This condition may be diagnosed with: A medical history. A physical exam. A series of tests that are done while you are sleeping (sleep study). These tests are usually done in a sleep lab, but they may also be done at home. How is this treated? Treatment for this condition aims to restore normal breathing and to ease symptoms during sleep. It may involve managing health issues that can affect breathing, such as high blood pressure or obesity. Treatment may include: Sleeping on your side. Using a decongestant if you have nasal congestion. Avoiding the use of depressants, including alcohol, sedatives, and narcotics. Losing weight if you are overweight. Making changes to your diet. Quitting smoking. Using a device to open your airway while you sleep, such as: An oral appliance. This is a custom-made mouthpiece that shifts your lower jaw forward. A continuous positive airway pressure (CPAP) device. This device blows air through a mask when you breathe out (exhale). A nasal expiratory positive airway pressure (EPAP) device. This device has valves that you put into each nostril. A bi-level positive airway pressure (BIPAP) device. This device blows air through a mask when you breathe in (inhale) and breathe out (exhale). Having surgery if other treatments do not work. During surgery, excess tissue is removed to create a wider airway. Follow these instructions at home: Lifestyle Make any lifestyle changes that your health care provider recommends. Eat a healthy, well-balanced diet. Take steps to lose weight if you are overweight. Avoid using depressants, including alcohol, sedatives, and narcotics. Do not use any products that contain nicotine or tobacco. These products include cigarettes, chewing tobacco, and vaping devices, such as e-cigarettes. If you need help quitting,  ask your health care provider. General instructions Take over-the-counter and prescription  medicines only as told by your health care provider. If you were given a device to open your airway while you sleep, use it only as told by your health care provider. If you are having surgery, make sure to tell your health care provider you have sleep apnea. You may need to bring your device with you. Keep all follow-up visits. This is important. Contact a health care provider if: The device that you received to open your airway during sleep is uncomfortable or does not seem to be working. Your symptoms do not improve. Your symptoms get worse. Get help right away if: You develop: Chest pain. Shortness of breath. Discomfort in your back, arms, or stomach. You have: Trouble speaking. Weakness on one side of your body. Drooping in your face. These symptoms may represent a serious problem that is an emergency. Do not wait to see if the symptoms will go away. Get medical help right away. Call your local emergency services (911 in the [place] Do not drive yourself to the hospital. Summary Sleep apnea is a condition in which breathing pauses or becomes shallow during sleep. The most common cause is a collapsed or blocked airway. The goal of treatment is to restore normal breathing and to ease symptoms during sleep. This information is not intended to replace advice given to you by your health care provider. Make sure you discuss any questions you have with your health care provider. Document Revised: 07/27/2020 Document Reviewed: 11/27/2019 Elsevier Patient Education  2024 [employer].

## 2022-07-02 NOTE — Progress Notes (Signed)
[facility]    Provider:  Dr Lucia Gaskins Requesting Provider: Daisy Floro, MD Primary Care Provider:  Daisy Floro, MD  CC:  ED referral for TIA  07/02/2022; Patient here to review workup. We reviewed images and results as follows:   CTA H&N: IMPRESSION: 1. Mild enlargement of lymph nodes in the upper mediastinum, with no indication of adenopathy on cardiac CT from 2023. Recommend chest CT follow-up. 2. No emergent vascular finding. 3. There is atherosclerosis without significant stenosis or irregularity of major arteries in the head and neck.  Also noticed: Aorta: Aortic dilatation noted. There is mild dilatation of the ascending  aorta, measuring 42 mm.  (This was sent to Dr. Tenny Craw)  We discussed lymph nodes, He does not want contrast due to kidney disease  Echo: IMPRESSIONS     1. Left ventricular ejection fraction, by estimation, is 55 to 60%. The  left ventricle has normal function. The left ventricle has no regional  wall motion abnormalities. Left ventricular diastolic parameters are  consistent with Grade I diastolic  dysfunction (impaired relaxation).   2. Right ventricular systolic function is normal. The right ventricular  size is normal.   3. The mitral valve is normal in structure. Trivial mitral valve  regurgitation. No evidence of mitral stenosis.   4. The aortic valve is normal in structure. Aortic valve regurgitation is  not visualized. No aortic stenosis is present.   5. Aortic dilatation noted. There is mild dilatation of the ascending  aorta, measuring 42 mm.   6. The inferior vena cava is normal in size with greater than 50%  respiratory variability, suggesting right atrial pressure of 3 mmHg.   7. Agitated saline contrast bubble study was negative, with no evidence  of any interatrial shunt.   30-day monitor: Sinus rhythm HR 35-114 bpm, avg 49 Bradycardia events occurred during the daytime. No symptoms reported.   We  informed patient of HR, 35 is very low for a heart rate: No arrhythmias on cardiac event monitor that could be responsible for TIA/stroke.  He did however have some episodes of very low heart rate and if you are having very low heart rate during her episode that could be the cause, you should discuss this with your primary care Dr. Tenny Craw or if you have a cardiologist discuss it with them.  forwarded to Dr. Tenny Craw  He consulted with [facility] and he will do sleep study at [facility].  Today he also mentions some possible cognitive decline, his Montreal cognitive assessment score was 22 out of 30 today, today is an appointment to review his TIA evaluation, he should see his primary care and if needed we can see him for cognitive decline.  Patient complains of symptoms per HPI as well as the following symptoms: none . Pertinent negatives and positives per HPI. All others negative  Mychart to pcp and patient 04/02/2022: Dr. Tenny Craw, Mr. Yoke cholesterol is awful. His ldl is 176. I'm sure you know this because it sounds like he refuses statins. I told him this is a stroke risk factor and he should treat it fyi thanks Dr. Lucia Gaskins  HPI:  MAL GRISCOM is a 77 y.o. male here as requested by Daisy Floro, MD for ED follow up for migraine vs TIA. has HYPERLIPIDEMIA; MIGRAINE, CHRONIC; DYSPNEA; PROSTATITIS, HX OF; INGUINAL HERNIA, RIGHT, HX OF; Chronic cough; Precordial pain; Cough variant asthma; Post-nasal drip; CKD (chronic kidney disease) stage 2, GFR 60-89 ml/min; Elevated blood pressure reading in  office without diagnosis of hypertension; Graves disease; Microscopic hematuria; NSAID long-term use; Positive ANA (antinuclear antibody); Postablative hypothyroidism; Pain in right knee; Lymphadenopathy, mediastinal; and TIA (transient ischemic attack) on their problem list.  History from review of chart as well as patient.  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  Symptoms lasted 15 minutes before resolving spontaneously.  Exam was nonfocal in the emergency room.  He does have multiple comorbid medical problems.  He was currently asymptomatic.  Neurologic exam was also normal.  CT of the head, MRI of the head showed nothing acute.  Screening labs, EKG, all reassuring.  Patient declined admission.  Patient think it was a migraine with aura. He has had migraines for 20 years but very infrequent. Daggers in his eyes, severe, he hasn;t had one in years. He was blinded, he saw a patient, crescent-shape pattern, burred around the eyes, was in both eyes and when he closed his eyes he still saw the patterns. Lasted 15 minutes, felt cognitively impaired and then right-sided slight weakness, had difficulty speaking   Reviewed notes, labs and imaging from outside physicians, which showed:  MRI brain: EXAM: MRI HEAD WITHOUT CONTRAST   TECHNIQUE: Multiplanar, multiecho pulse sequences of the brain and surrounding structures were obtained without intravenous contrast.   COMPARISON:  CT Head 03/08/22   FINDINGS: Brain: Negative for acute infarct. No hydrocephalus. No extra-axial fluid collection. No significant microvascular ischemic change. No hemorrhage. No midline shift   Vascular: Normal flow voids.   Skull and upper cervical spine: Normal marrow signal.   Sinuses/Orbits: Trace right mastoid effusion. No middle ear effusion. Paranasal sinuses are grossly clear. Orbits are unremarkable.   Other: None.   IMPRESSION: No acute abnormality.  No finding to explain vision changes.      Latest Ref Rng & Units 03/29/2022   11:06 AM 03/08/2022    4:47 PM  CMP  Glucose 70 - 99 mg/dL 604  91   BUN 8 - 27 mg/dL 13  15   Creatinine 5.40 - 1.27 mg/dL 9.81  1.91   Sodium 478 - 144 mmol/L 142  137   Potassium 3.5 - 5.2 mmol/L 4.8  4.0    Chloride 96 - 106 mmol/L 104  105   CO2 20 - 29 mmol/L 22  21   Calcium 8.6 - 10.2 mg/dL 9.3  9.1   Total Protein 6.5 - 8.1 g/dL  7.7   Total Bilirubin 0.3 - 1.2 mg/dL  3.1   Alkaline Phos 38 - 126 U/L  61   AST 15 - 41 U/L  21   ALT 0 - 44 U/L  18       Latest Ref Rng & Units 03/08/2022    4:47 PM 06/16/2010    7:12 PM 06/12/2010    3:00 PM  CBC  WBC 4.0 - 10.5 K/uL 4.8  6.8  4.2   Hemoglobin 13.0 - 17.0 g/dL 29.5  62.1  30.8   Hematocrit 39.0 - 52.0 % 50.1  45.3  44.6   Platelets 150 - 400 K/uL 170  178  185       Review of Systems: Patient complains of symptoms per HPI as well as the following symptoms tia. Pertinent negatives and positives per HPI. All others negative.   Social  History   Socioeconomic History   Marital status: Married    Spouse name: Not on file   Number of children: Not on file   Years of education: Not on file   Highest education level: Not on file  Occupational History   Not on file  Tobacco Use   Smoking status: Never   Smokeless tobacco: Never  Substance and Sexual Activity   Alcohol use: Not Currently   Drug use: Not on file   Sexual activity: Not on file  Other Topics Concern   Not on file  Social History Narrative   Teaches High school   Social Determinants of Health   Financial Resource Strain: Not on file  Food Insecurity: Not on file  Transportation Needs: Not on file  Physical Activity: Not on file  Stress: Not on file  Social Connections: Not on file  Intimate Partner Violence: Not on file    Family History  Problem Relation Age of Onset   Lymphoma Father    Cancer Father    Cancer Paternal Grandfather    Migraines Neg Hx    Stroke Neg Hx     Past Medical History:  Diagnosis Date   Enlarged prostate    Migraine    Thyroid disease     Patient Active Problem List   Diagnosis Date Noted   Lymphadenopathy, mediastinal 07/08/2022   TIA (transient ischemic attack) 07/08/2022   Pain in right knee 08/13/2017    Elevated blood pressure reading in office without diagnosis of hypertension 01/30/2017   Positive ANA (antinuclear antibody) 01/30/2017   CKD (chronic kidney disease) stage 2, GFR 60-89 ml/min 12/26/2016   Graves disease 12/26/2016   Microscopic hematuria 12/26/2016   NSAID long-term use 12/26/2016   Postablative hypothyroidism 12/26/2016   Cough variant asthma 08/16/2015   Post-nasal drip 08/16/2015   Chronic cough 06/24/2015   Precordial pain 06/24/2015   HYPERLIPIDEMIA 07/12/2009   MIGRAINE, CHRONIC 07/12/2009   DYSPNEA 07/12/2009   PROSTATITIS, HX OF 07/12/2009   INGUINAL HERNIA, RIGHT, HX OF 07/12/2009    Past Surgical History:  Procedure Laterality Date   colonscopy     has had polyps removed. (precancerous)   HERNIA REPAIR     thyroid ablation      Current Outpatient Medications  Medication Sig Dispense Refill   acetaminophen (TYLENOL) 500 MG tablet Take 500 mg by mouth every 6 (six) hours as needed.     aspirin EC 81 MG tablet Take 1 tablet (81 mg total) by mouth daily. Swallow whole. 30 tablet 12   Cholecalciferol (VITAMIN D3) 5000 units CAPS Take 1 capsule by mouth 2 (two) times a week.     levothyroxine (SYNTHROID, LEVOTHROID) 125 MCG tablet Take 125 mcg by mouth daily.       lidocaine (LIDODERM) 5 % SMARTSIG:1-2 Patch(s) Topical Daily     LORazepam (ATIVAN) 1 MG tablet lorazepam 1 mg tablet     vitamin B-12 (CYANOCOBALAMIN) 500 MCG tablet Take 500 mcg by mouth daily.     No current facility-administered medications for this visit.    Allergies as of 07/02/2022 - Review Complete 07/02/2022  Allergen Reaction Noted   Lidocaine Anaphylaxis and Other (See Comments) 12/14/2016   Pseudoephedrine Anaphylaxis 12/14/2016   Pseudoephedrine hcl Other (See Comments) 07/02/2019    Vitals: BP 106/77   Pulse 66   Ht 5\' 8"  (1.727 m)   Wt 220 lb (99.8 kg)   BMI 33.45 kg/m  Last Weight:  Wt Readings from Last 1  Encounters:  07/02/22 220 lb (99.8 kg)   Last Height:    Ht Readings from Last 1 Encounters:  07/02/22 5\' 8"  (1.727 m)   Physical exam: Exam: Gen: NAD, conversant, well nourised, obese, well groomed                     CV: RRR, no MRG. No Carotid Bruits. No peripheral edema, warm, nontender Eyes: Conjunctivae clear without exudates or hemorrhage  Neuro: Detailed Neurologic Exam  Speech:    Speech is normal; fluent and spontaneous with normal comprehension.  Cognition:        02/20/2021    1:19 PM  MMSE - Mini Mental State Exam  Orientation to time 4  Orientation to Place 5  Registration 3  Attention/ Calculation 5  Recall 3  Language- name 2 objects 2  Language- repeat 1  Language- follow 3 step command 3  Language- read & follow direction 1  Write a sentence 1  Copy design 1  Total score 29      07/02/2022    4:56 PM  Montreal Cognitive Assessment   Visuospatial/ Executive (0/5) 4  Naming (0/3) 3  Attention: Read list of digits (0/2) 1  Attention: Read list of letters (0/1) 1  Attention: Serial 7 subtraction starting at 100 (0/3) 1  Language: Repeat phrase (0/2) 1  Language : Fluency (0/1) 1  Abstraction (0/2) 2  Delayed Recall (0/5) 2  Orientation (0/6) 6  Total 22    Cranial Nerves:    The pupils are equal, round, and reactive to light.  Attempted, pupils too small to visualize fundi.  Visual fields are full to finger confrontation. Extraocular movements are intact. Trigeminal sensation is intact and the muscles of mastication are normal. The face is symmetric. The palate elevates in the midline. Hearing intact. Voice is normal. Shoulder shrug is normal. The tongue has normal motion without fasciculations.   Coordination: No dysmetria or ataxia noted Gait: Normal gait Motor Observation:    No asymmetry, no atrophy, and no involuntary movements noted. Tone:    Normal muscle tone.    Posture:    Posture is normal. normal erect    Strength:    Strength is V/V in the upper and lower limbs.      Sensation:  intact to LT     Reflex Exam:  DTR's:    Deep tendon reflexes in the upper and lower extremities are symmetrical bilaterally.   Toes:    The toes are equiv bilaterally.   Clonus:    Clonus is absent.   Assessment/Plan:  DANTAE LEISHER is a 77 y.o. male here as requested by Glyn Ade, MD for ED follow up for migraine vs TIA. 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  Mychart to pcp and patient 04/02/2022: Dr. Tenny Craw, Mr. Aniello cholesterol is awful. His ldl is 176. I'm sure you know this because it sounds like he refuses statins. I told him this is a stroke risk factor and he should treat it fyi thanks Dr. Lucia Gaskins (he declined statins during appt with me) I think he needs to take asa 81mg  daily but he stopped He  refuses statins for his ldl   Patient here to review workup. We reviewed images and results as follows:  Needs f/u CT chest for lymph nodes seen, will order, On 6/24.2024 Creat was 1.41 and he declines contrast.  Also, Today he also mentions some possible cognitive decline, his Montreal cognitive assessment score was 22 out of 30 today, today is an appointment to review his TIA evaluation, he should see his primary care and if needed we can see him for cognitive decline which I have not evaluated him for. He saw Dr. Teresa Coombs and I informed Dr. Teresa Coombs to order further testing based on his clinical evaluation when he saw patient for cognitive changes, I saw him for TIA only.   CTA H&N: IMPRESSION: 1. Mild enlargement of lymph nodes in the upper mediastinum,  with no indication of adenopathy on cardiac CT from 2023. Recommend chest CT follow-up. 2. No emergent vascular finding. 3. There is atherosclerosis without significant stenosis or irregularity of major arteries in the head and neck.  Also noticed: Aorta: Aortic dilatation noted. There is mild dilatation of the ascending  aorta, measuring 42 mm.  (This was sent to Dr. Tenny Craw)  We discussed lymph nodes, He does not want contrast due to kidney disease  Echo: IMPRESSIONS     1. Left ventricular ejection fraction, by estimation, is 55 to 60%. The  left ventricle has normal function. The left ventricle has no regional  wall motion abnormalities. Left ventricular diastolic parameters are  consistent with Grade I diastolic  dysfunction (impaired relaxation).   2. Right ventricular systolic function is normal. The right ventricular  size is normal.   3. The mitral valve is normal in structure. Trivial mitral valve  regurgitation. No evidence of mitral stenosis.   4. The aortic valve is normal in structure. Aortic valve regurgitation is  not visualized. No aortic stenosis is present.   5. Aortic dilatation noted. There is mild dilatation of the ascending  aorta, measuring 42 mm.   6. The inferior vena cava is normal in size with greater than 50%  respiratory variability, suggesting right atrial pressure of 3 mmHg.   7. Agitated saline contrast bubble study was negative, with no evidence  of any interatrial shunt.   30-day monitor: Sinus rhythm HR 35-114 bpm, avg 49 Bradycardia events occurred during the daytime. No symptoms reported.   We informed patient of HR, 35 is very low for a heart rate: No arrhythmias on cardiac event monitor that could be responsible for TIA/stroke.  He did however have some episodes of very low heart rate and if you are having very low heart rate during her episode that could be the cause, you should discuss this with your primary care Dr. Tenny Craw or if you have a  cardiologist discuss it with them.  forwarded to Dr. Tenny Craw  He consulted with [facility] and he will do sleep study at [facility].  CTA of the head and neck as above, CT for mediastinal lymphadenopathy declines contrast Echocardiogram of the heart as above 30-day holter monitor to look for atrial fibrillation he declines loop: as above Needs < 70 LDL (f/u with Dr. Tenny Craw) - check cholesterol panel, was elevated significantly, declines statins HgbA1c < 6 (f/u with Dr. Tenny Craw) - check hgba1c - 5.2 Risk for sleep apnea: snores, excessive fatigue, TIA, obese, wakes with dry mouth, can fall asleep: recommended sleep eval, will follow up with [facility] 81mg  for stroke protection daily - recommended   Orders Placed This Encounter  Procedures   CT CHEST WO CONTRAST   Meds ordered this encounter  Medications   aspirin EC 81 MG tablet    Sig: Take 1 tablet (81 mg total) by mouth daily. Swallow whole.    Dispense:  30 tablet    Refill:  12    Cc: Daisy Floro, MD,  Daisy Floro, MD  Naomie Dean, MD  [facility] [address] [place], [place] [address]  Phone [phone] Fax [phone]  I spent over 40 minutes of face-to-face and non-face-to-face time with patient on the  1. Lymphadenopathy, mediastinal   2. TIA (transient ischemic attack)     diagnosis.  This included previsit chart review, lab review, study review, order entry, electronic health record documentation, patient education on the different diagnostic and therapeutic options, counseling and coordination of care, risks and benefits of management, compliance, or risk factor reduction

## 2022-07-08 DIAGNOSIS — G459 Transient cerebral ischemic attack, unspecified: Secondary | ICD-10-CM | POA: Insufficient documentation

## 2022-07-08 DIAGNOSIS — R590 Localized enlarged lymph nodes: Secondary | ICD-10-CM | POA: Insufficient documentation

## 2022-07-08 MED ORDER — ASPIRIN 81 MG PO TBEC: 81.0000 mg | DELAYED_RELEASE_TABLET | Freq: Every day | ORAL | 12 refills | Status: AC

## 2022-07-09 ENCOUNTER — Telehealth: Payer: Self-pay | Admitting: Neurology

## 2022-07-09 NOTE — Telephone Encounter (Signed)
Joseph Yang: [id number] exp. 07/09/22-08/07/22 sent to GI [phone]

## 2022-07-31 ENCOUNTER — Ambulatory Visit
Admission: RE | Admit: 2022-07-31 | Discharge: 2022-07-31 | Disposition: A | Discharge status: Home / Self Care | Payer: BC Managed Care – PPO | Source: Ambulatory Visit | Attending: Neurology | Admitting: Neurology

## 2022-07-31 DIAGNOSIS — R590 Localized enlarged lymph nodes: Secondary | ICD-10-CM

## 2022-10-10 ENCOUNTER — Ambulatory Visit
Admission: EM | Admit: 2022-10-10 | Discharge: 2022-10-10 | Disposition: A | Discharge status: Home / Self Care | Payer: BC Managed Care – PPO | Attending: Internal Medicine | Admitting: Internal Medicine

## 2022-10-10 DIAGNOSIS — H00015 Hordeolum externum left lower eyelid: Secondary | ICD-10-CM | POA: Diagnosis not present

## 2022-10-10 MED ORDER — ERYTHROMYCIN 5 MG/GM OP OINT: TOPICAL_OINTMENT | OPHTHALMIC | 0 refills | Status: AC

## 2022-10-10 NOTE — Discharge Instructions (Signed)
I have prescribed an antibiotic ointment for your stye.  Please use warm compresses as well.  Follow-up with eye doctor if symptoms persist or worsen.

## 2022-10-10 NOTE — ED Triage Notes (Signed)
Patient here today with c/o stye on left lower eyelid X 3 days. He has been using moisturizing eye drops with some relief.

## 2022-10-10 NOTE — ED Provider Notes (Signed)
[facility]    CSN: [id number] Arrival date & time: 10/10/22  1755      History   Chief Complaint Chief Complaint  Patient presents with   Eye Problem    HPI Joseph Yang is a 77 y.o. male.   Patient presents concern of stye to the left lower eyelid that has been present starting today.  Reports that he has been using Pataday eyedrops.  Denies any drainage from the area.  Denies any trauma to the eye.  Patient's heart rate is also in the 40s.  He states this is baseline for him.  He denies chest pain, shortness of breath, dizziness, blurry vision.   Eye Problem   Past Medical History:  Diagnosis Date   Enlarged prostate    Migraine    Thyroid disease     Patient Active Problem List   Diagnosis Date Noted   Lymphadenopathy, mediastinal 07/08/2022   TIA (transient ischemic attack) 07/08/2022   Pain in right knee 08/13/2017   Elevated blood pressure reading in office without diagnosis of hypertension 01/30/2017   Positive ANA (antinuclear antibody) 01/30/2017   CKD (chronic kidney disease) stage 2, GFR 60-89 ml/min 12/26/2016   Graves disease 12/26/2016   Microscopic hematuria 12/26/2016   NSAID long-term use 12/26/2016   Postablative hypothyroidism 12/26/2016   Cough variant asthma 08/16/2015   Post-nasal drip 08/16/2015   Chronic cough 06/24/2015   Precordial pain 06/24/2015   HYPERLIPIDEMIA 07/12/2009   Migraine headache 07/12/2009   DYSPNEA 07/12/2009   PROSTATITIS, HX OF 07/12/2009   INGUINAL HERNIA, RIGHT, HX OF 07/12/2009    Past Surgical History:  Procedure Laterality Date   colonscopy     has had polyps removed. (precancerous)   HERNIA REPAIR     thyroid ablation         Home Medications    Prior to Admission medications   Medication Sig Start Date End Date Taking? Authorizing Provider  erythromycin ophthalmic ointment Place a 1/2 inch ribbon of ointment into the lower eyelid 4 times daily. 10/10/22  Yes , Rolly Salter E, FNP   acetaminophen (TYLENOL) 500 MG tablet Take 500 mg by mouth every 6 (six) hours as needed.    [provider]  aspirin EC 81 MG tablet Take 1 tablet (81 mg total) by mouth daily. Swallow whole. 07/08/22   Anson Fret, MD  Cholecalciferol (VITAMIN D3) 5000 units CAPS Take 1 capsule by mouth 2 (two) times a week.    [provider]  levothyroxine (SYNTHROID, LEVOTHROID) 125 MCG tablet Take 125 mcg by mouth daily.      [provider]  lidocaine (LIDODERM) 5 % SMARTSIG:1-2 Patch(s) Topical Daily 07/18/20   [provider]  LORazepam (ATIVAN) 1 MG tablet lorazepam 1 mg tablet 07/01/18   [provider]  vitamin B-12 (CYANOCOBALAMIN) 500 MCG tablet Take 500 mcg by mouth daily.    [provider]    Family History Family History  Problem Relation Age of Onset   Lymphoma Father    Cancer Father    Cancer Paternal Grandfather    Migraines Neg Hx    Stroke Neg Hx     Social History Social History   Tobacco Use   Smoking status: Never   Smokeless tobacco: Never  Substance Use Topics   Alcohol use: Not Currently     Allergies   Lidocaine, Pseudoephedrine, and Pseudoephedrine hcl   Review of Systems Review of Systems Per HPI  Physical Exam Triage Vital  Signs ED Triage Vitals [10/10/22 1840]  Encounter Vitals Group     BP (!) 150/77     Systolic BP Percentile      Diastolic BP Percentile      Pulse Rate (!) 40     Resp 16     Temp 98.5 F (36.9 C)     Temp Source Oral     SpO2 97 %     Weight 220 lb (99.8 kg)     Height 5\' 10"  (1.778 m)     Head Circumference      Peak Flow      Pain Score 4     Pain Loc      Pain Education      Exclude from Growth Chart    No data found.  Updated Vital Signs BP (!) 150/77 (BP Location: Left Arm)   Pulse (!) 40   Temp 98.5 F (36.9 C) (Oral)   Resp 16   Ht 5\' 10"  (1.778 m)   Wt 220 lb (99.8 kg)   SpO2 97%   BMI 31.57 kg/m   Visual Acuity Right Eye Distance:   Left  Eye Distance:   Bilateral Distance:    Right Eye Near:   Left Eye Near:    Bilateral Near:     Physical Exam Constitutional:      General: He is not in acute distress.    Appearance: Normal appearance. He is not toxic-appearing or diaphoretic.  HENT:     Head: Normocephalic and atraumatic.  Eyes:     Extraocular Movements: Extraocular movements intact.     Conjunctiva/sclera: Conjunctivae normal.      Comments: Patient has a very small erythematous stye with no purulent drainage present to left mid lower eyelid.  Cardiovascular:     Rate and Rhythm: Regular rhythm. Bradycardia present.     Pulses: Normal pulses.     Heart sounds: Normal heart sounds.  Pulmonary:     Effort: Pulmonary effort is normal. No respiratory distress.     Breath sounds: Normal breath sounds.  Neurological:     General: No focal deficit present.     Mental Status: He is alert and oriented to person, place, and time. Mental status is at baseline.  Psychiatric:        Mood and Affect: Mood normal.        Behavior: Behavior normal.        Thought Content: Thought content normal.        Judgment: Judgment normal.      UC Treatments / Results  Labs (all labs ordered are listed, but only abnormal results are displayed) Labs Reviewed - No data to display  EKG   Radiology No results found.  Procedures Procedures (including critical care time)  Medications Ordered in UC Medications - No data to display  Initial Impression / Assessment and Plan / UC Course  I have reviewed the triage vital signs and the nursing notes.  Pertinent labs & imaging results that were available during my care of the patient were reviewed by me and considered in my medical decision making (see chart for details).     Patient has very small stye with no significant swelling present to left lower eyelid.  Will treat with erythromycin and patient advised to use warm compresses.  Advised patient to follow-up with his  established ophthalmologist if stye persist or worsens.  Patient is bradycardic on triage but reports this is baseline, and after further review  of the chart, this does appear baseline.  He is not symptomatic as well which is reassuring that no emergent evaluation or EKG is necessary.  Patient verbalized understanding and was agreeable with plan. Final Clinical Impressions(s) / UC Diagnoses   Final diagnoses:  Hordeolum externum of left lower eyelid     Discharge Instructions      I have prescribed an antibiotic ointment for your stye.  Please use warm compresses as well.  Follow-up with eye doctor if symptoms persist or worsen.      ED Prescriptions     Medication Sig Dispense Auth. Provider   erythromycin ophthalmic ointment Place a 1/2 inch ribbon of ointment into the lower eyelid 4 times daily. 3.5 g Gustavus Bryant, [place]      PDMP not reviewed this encounter.   Gustavus Bryant, [place] 10/10/22 [phone]

## 2022-10-11 ENCOUNTER — Other Ambulatory Visit: Payer: Self-pay | Admitting: Family Medicine

## 2022-10-11 DIAGNOSIS — Z09 Encounter for follow-up examination after completed treatment for conditions other than malignant neoplasm: Secondary | ICD-10-CM

## 2022-10-11 DIAGNOSIS — R599 Enlarged lymph nodes, unspecified: Secondary | ICD-10-CM

## 2022-11-12 ENCOUNTER — Ambulatory Visit
Admission: RE | Admit: 2022-11-12 | Discharge: 2022-11-12 | Disposition: A | Discharge status: Home / Self Care | Payer: BC Managed Care – PPO | Source: Ambulatory Visit | Attending: Family Medicine | Admitting: Family Medicine

## 2022-11-12 DIAGNOSIS — R599 Enlarged lymph nodes, unspecified: Secondary | ICD-10-CM

## 2022-11-12 DIAGNOSIS — Z09 Encounter for follow-up examination after completed treatment for conditions other than malignant neoplasm: Secondary | ICD-10-CM

## 2023-10-31 ENCOUNTER — Encounter: Payer: Self-pay | Admitting: Emergency Medicine

## 2023-10-31 ENCOUNTER — Ambulatory Visit: Admission: EM | Admit: 2023-10-31 | Discharge: 2023-10-31 | Disposition: A | Discharge status: Home / Self Care

## 2023-10-31 DIAGNOSIS — B349 Viral infection, unspecified: Secondary | ICD-10-CM

## 2023-10-31 LAB — POC COVID19/FLU A&B COMBO
Covid Antigen, POC: NEGATIVE
Influenza A Antigen, POC: NEGATIVE
Influenza B Antigen, POC: NEGATIVE

## 2023-10-31 LAB — POCT RAPID STREP A (OFFICE): Rapid Strep A Screen: NEGATIVE

## 2023-10-31 MED ORDER — PROMETHAZINE-DM 6.25-15 MG/5ML PO SYRP: 10.0000 mL | ORAL_SOLUTION | Freq: Three times a day (TID) | ORAL | 0 refills | Status: AC | PRN

## 2023-10-31 MED ORDER — AZELASTINE HCL 0.1 % NA SOLN
1.0000 | Freq: Two times a day (BID) | NASAL | 1 refills | Status: AC
Start: 2023-10-31 — End: ?

## 2023-10-31 MED ORDER — PREDNISONE 20 MG PO TABS: 40.0000 mg | ORAL_TABLET | Freq: Every day | ORAL | 0 refills | Status: AC

## 2023-10-31 NOTE — ED Provider Notes (Signed)
 [facility]  Note:  This document was prepared using Conservation officer, historic buildings and may include unintentional dictation errors.  MRN: [id number] DOB: October 14, 1945  Subjective:   Joseph Yang is a 78 y.o. male presenting for evaluation of sore throat, mildly productive cough x 6 to 7 days.  Patient denies any fever, chills, severe nasal congestion, body aches, weakness, shortness of breath, chest pain.  Patient denies any use of over-the-counter medication to treat symptoms prior to arrival today.  Patient would like testing for strep, COVID, influenza.  Patient is concerned that illness may be contagious to his wife who is preparing for knee replacement surgery very soon and does not want to get her sick.  No current facility-administered medications for this encounter.  Current Outpatient Medications:    acetaminophen (TYLENOL) 500 MG tablet, Take 500 mg by mouth every 6 (six) hours as needed., Disp: , Rfl:    aspirin  EC 81 MG tablet, Take 1 tablet (81 mg total) by mouth daily. Swallow whole., Disp: 30 tablet, Rfl: 12   azelastine (ASTELIN) 0.1 % nasal spray, Place 1 spray into both nostrils 2 (two) times daily. Use in each nostril as directed, Disp: 30 mL, Rfl: 1   Cholecalciferol (VITAMIN D3) 5000 units CAPS, Take 1 capsule by mouth 2 (two) times a week., Disp: , Rfl:    levothyroxine (SYNTHROID, LEVOTHROID) 125 MCG tablet, Take 125 mcg by mouth daily.  , Disp: , Rfl:    lidocaine (LIDODERM) 5 %, SMARTSIG:1-2 Patch(s) Topical Daily, Disp: , Rfl:    predniSONE  (DELTASONE ) 20 MG tablet, Take 2 tablets (40 mg total) by mouth daily for 5 days., Disp: 10 tablet, Rfl: 0   promethazine-dextromethorphan (PROMETHAZINE-DM) 6.25-15 MG/5ML syrup, Take 10 mLs by mouth 3 (three) times daily as needed., Disp: 240 mL, Rfl: 0   vitamin B-12 (CYANOCOBALAMIN) 500 MCG tablet, Take 500 mcg by mouth daily., Disp: , Rfl:    bisacodyl (DULCOLAX) 5 MG EC tablet, See admin instructions. (Patient  not taking: Reported on 10/31/2023), Disp: , Rfl:    erythromycin  ophthalmic ointment, Place a 1/2 inch ribbon of ointment into the lower eyelid 4 times daily. (Patient not taking: Reported on 10/31/2023), Disp: 3.5 g, Rfl: 0   LORazepam (ATIVAN) 1 MG tablet, lorazepam 1 mg tablet (Patient not taking: Reported on 10/31/2023), Disp: , Rfl:    polyethylene glycol-electrolytes (NULYTELY) 420 g solution, See admin instructions. (Patient not taking: Reported on 10/31/2023), Disp: , Rfl:    tamsulosin (FLOMAX) 0.4 MG CAPS capsule, , Disp: , Rfl:    Allergies  Allergen Reactions   Lidocaine Anaphylaxis and Other (See Comments)    Nasal lidocaine.  Pt states he uses the Lidocaine patch from time to time and does okay with that.  Pt reports, 'after laryngoscopy I had severe eye pain, but unsure if it was from lidocaine. The doctors put a lidocaine allergy on my permanent record for some reason'  Nasal lidocaine.  Pt states he uses the Lidocaine patch from time to time and does okay with that.    lidocaine  lidocaine   Pseudoephedrine Anaphylaxis and Other (See Comments)   Pseudoephedrine Hcl Other (See Comments) and Anaphylaxis    Past Medical History:  Diagnosis Date   Enlarged prostate    Migraine    Thyroid  disease      Past Surgical History:  Procedure Laterality Date   colonscopy     has had polyps removed. (precancerous)   HERNIA REPAIR  thyroid  ablation      Family History  Problem Relation Age of Onset   Lymphoma Father    Cancer Father    Cancer Paternal Grandfather    Migraines Neg Hx    Stroke Neg Hx     Social History   Tobacco Use   Smoking status: Never   Smokeless tobacco: Never  Vaping Use   Vaping status: Never Used  Substance Use Topics   Alcohol use: Not Currently   Drug use: Never    ROS Refer to HPI for ROS details.  Objective:   Vitals: BP 91/68 (BP Location: Left Arm)   Pulse 83   Temp 98.7 F (37.1 C) (Oral)   Resp 16   SpO2 95%    Physical Exam Vitals and nursing note reviewed.  Constitutional:      General: He is not in acute distress.    Appearance: Normal appearance. He is well-developed. He is not ill-appearing or toxic-appearing.  HENT:     Head: Normocephalic.     Nose: Nose normal. No congestion or rhinorrhea.     Mouth/Throat:     Mouth: Mucous membranes are moist.     Pharynx: Oropharynx is clear. No posterior oropharyngeal erythema.  Cardiovascular:     Rate and Rhythm: Normal rate and regular rhythm.     Heart sounds: Normal heart sounds. No murmur heard. Pulmonary:     Effort: Pulmonary effort is normal. No respiratory distress.     Breath sounds: Normal breath sounds. No stridor. No wheezing, rhonchi or rales.  Chest:     Chest wall: No tenderness.  Skin:    General: Skin is warm and dry.     Capillary Refill: Capillary refill takes less than 2 seconds.  Neurological:     General: No focal deficit present.     Mental Status: He is alert and oriented to person, place, and time.  Psychiatric:        Mood and Affect: Mood normal.        Behavior: Behavior normal.     Procedures  Results for orders placed or performed during the hospital encounter of 10/31/23 (from the past 24 hours)  POC Covid19/Flu A&B Antigen     Status: Normal   Collection Time: 10/31/23  2:48 PM  Result Value Ref Range   Influenza A Antigen, POC Negative Negative   Influenza B Antigen, POC Negative Negative   Covid Antigen, POC Negative Negative  POCT rapid strep A     Status: Normal   Collection Time: 10/31/23  2:48 PM  Result Value Ref Range   Rapid Strep A Screen Negative Negative    No results found.   Assessment and Plan :     Discharge Instructions       1. Acute viral syndrome (Primary) - azelastine (ASTELIN) 0.1 % nasal spray; Place 1 spray into both nostrils 2 (two) times daily. Use in each nostril as directed  Dispense: 30 mL; Refill: 1 - promethazine-dextromethorphan (PROMETHAZINE-DM)  6.25-15 MG/5ML syrup; Take 10 mLs by mouth 3 (three) times daily as needed.  Dispense: 240 mL; Refill: 0 - predniSONE  (DELTASONE ) 20 MG tablet; Take 2 tablets (40 mg total) by mouth daily for 5 days.  Dispense: 10 tablet; Refill: 0 - POC Covid19/Flu A&B Antigen complete in UC is negative for COVID and influenza - POCT rapid strep A complete and UC is negative for strep pharyngitis -Continue to monitor symptoms for any change in severity if there is any escalation  of current symptoms or development of new symptoms follow-up in ER for further evaluation and management.       Loudon Krakow B Sammuel Blick   Lacey Dotson, [place], [place] 10/31/23 1457

## 2023-10-31 NOTE — ED Triage Notes (Signed)
 Pt reports sore throat and productive cough x6 days. Denies fevers, chills, nasal congestion, and body aches. No med use for symptoms. Pt would like to be tested for strep, covid, and flu as his wife is scheduled for a knee replacement surgery soon.

## 2023-10-31 NOTE — Discharge Instructions (Addendum)
  1. Acute viral syndrome (Primary) - azelastine (ASTELIN) 0.1 % nasal spray; Place 1 spray into both nostrils 2 (two) times daily. Use in each nostril as directed  Dispense: 30 mL; Refill: 1 - promethazine-dextromethorphan (PROMETHAZINE-DM) 6.25-15 MG/5ML syrup; Take 10 mLs by mouth 3 (three) times daily as needed.  Dispense: 240 mL; Refill: 0 - predniSONE  (DELTASONE ) 20 MG tablet; Take 2 tablets (40 mg total) by mouth daily for 5 days.  Dispense: 10 tablet; Refill: 0 - POC Covid19/Flu A&B Antigen complete in UC is negative for COVID and influenza - POCT rapid strep A complete and UC is negative for strep pharyngitis -Continue to monitor symptoms for any change in severity if there is any escalation of current symptoms or development of new symptoms follow-up in ER for further evaluation and management.
